# Patient Record
Sex: Female | Born: 1980 | Race: Asian | Hispanic: No | Marital: Single | Smoking: Current every day smoker
Health system: Southern US, Community
[De-identification: ages and names within clinical notes are randomized; demographics above are authoritative.]

## PROBLEM LIST (undated history)

## (undated) DIAGNOSIS — F17200 Nicotine dependence, unspecified, uncomplicated: Secondary | ICD-10-CM

## (undated) DIAGNOSIS — N912 Amenorrhea, unspecified: Secondary | ICD-10-CM

## (undated) DIAGNOSIS — J189 Pneumonia, unspecified organism: Secondary | ICD-10-CM

## (undated) DIAGNOSIS — T7840XA Allergy, unspecified, initial encounter: Secondary | ICD-10-CM

## (undated) DIAGNOSIS — E119 Type 2 diabetes mellitus without complications: Secondary | ICD-10-CM

## (undated) HISTORY — DX: Type 2 diabetes mellitus without complications: E11.9

## (undated) HISTORY — DX: Pneumonia, unspecified organism: J18.9

## (undated) HISTORY — DX: Allergy, unspecified, initial encounter: T78.40XA

## (undated) HISTORY — DX: Amenorrhea, unspecified: N91.2

## (undated) HISTORY — DX: Nicotine dependence, unspecified, uncomplicated: F17.200

## (undated) HISTORY — DX: Morbid (severe) obesity due to excess calories: E66.01

## (undated) HISTORY — PX: DILATION AND CURETTAGE OF UTERUS: SHX78

---

## 2009-10-14 DIAGNOSIS — J189 Pneumonia, unspecified organism: Secondary | ICD-10-CM

## 2009-10-14 HISTORY — DX: Pneumonia, unspecified organism: J18.9

## 2009-10-17 DIAGNOSIS — E669 Obesity, unspecified: Secondary | ICD-10-CM

## 2009-10-17 DIAGNOSIS — E1169 Type 2 diabetes mellitus with other specified complication: Secondary | ICD-10-CM | POA: Insufficient documentation

## 2009-10-25 ENCOUNTER — Inpatient Hospital Stay (HOSPITAL_COMMUNITY): Admission: EM | Admit: 2009-10-25 | Discharge: 2009-10-30 | Payer: Self-pay | Admitting: Emergency Medicine

## 2009-10-25 ENCOUNTER — Ambulatory Visit: Payer: Self-pay | Admitting: Emergency Medicine

## 2009-10-26 ENCOUNTER — Encounter: Payer: Self-pay | Admitting: Internal Medicine

## 2009-10-26 LAB — CONVERTED CEMR LAB
ALT: 25 units/L
AST: 22 units/L
CO2: 20 meq/L
LDL Cholesterol: 42 mg/dL
Potassium: 3.9 meq/L
Sodium: 136 meq/L
TSH: 1.849 microintl units/mL

## 2009-10-27 ENCOUNTER — Encounter: Payer: Self-pay | Admitting: Internal Medicine

## 2009-10-27 LAB — CONVERTED CEMR LAB
Hemoglobin: 12.7 g/dL
Platelets: 148 10*3/uL
RBC: 3.97 M/uL

## 2009-10-28 ENCOUNTER — Encounter: Payer: Self-pay | Admitting: Internal Medicine

## 2009-10-28 LAB — CONVERTED CEMR LAB
HCT: 33.3 %
Hemoglobin: 11.7 g/dL
MCV: 91.9 fL
RDW: 13.5 %
WBC: 15 10*3/uL

## 2009-11-06 ENCOUNTER — Ambulatory Visit: Payer: Self-pay | Admitting: Pulmonary Disease

## 2009-11-06 DIAGNOSIS — J189 Pneumonia, unspecified organism: Secondary | ICD-10-CM

## 2009-11-09 ENCOUNTER — Encounter: Payer: Self-pay | Admitting: Adult Health

## 2009-11-19 ENCOUNTER — Telehealth: Payer: Self-pay | Admitting: Adult Health

## 2009-11-19 ENCOUNTER — Encounter: Payer: Self-pay | Admitting: Adult Health

## 2009-11-20 ENCOUNTER — Ambulatory Visit: Payer: Self-pay | Admitting: Pulmonary Disease

## 2009-11-20 LAB — CONVERTED CEMR LAB
BUN: 8 mg/dL (ref 6–23)
CO2: 30 meq/L (ref 19–32)
Calcium: 9.4 mg/dL (ref 8.4–10.5)
GFR calc non Af Amer: 123.06 mL/min (ref >60)
Glucose, Bld: 219 mg/dL — ABNORMAL HIGH (ref 70–99)
Sodium: 139 meq/L (ref 135–145)

## 2009-11-21 ENCOUNTER — Encounter: Admission: RE | Admit: 2009-11-21 | Discharge: 2009-12-13 | Payer: Self-pay | Admitting: Critical Care Medicine

## 2009-11-30 ENCOUNTER — Encounter: Payer: Self-pay | Admitting: Internal Medicine

## 2009-12-04 ENCOUNTER — Ambulatory Visit: Payer: Self-pay | Admitting: Internal Medicine

## 2009-12-04 DIAGNOSIS — R0989 Other specified symptoms and signs involving the circulatory and respiratory systems: Secondary | ICD-10-CM

## 2009-12-04 DIAGNOSIS — R0609 Other forms of dyspnea: Secondary | ICD-10-CM

## 2009-12-04 DIAGNOSIS — F17200 Nicotine dependence, unspecified, uncomplicated: Secondary | ICD-10-CM

## 2009-12-18 ENCOUNTER — Telehealth (INDEPENDENT_AMBULATORY_CARE_PROVIDER_SITE_OTHER): Payer: Self-pay | Admitting: *Deleted

## 2010-02-12 ENCOUNTER — Ambulatory Visit: Payer: Self-pay | Admitting: Internal Medicine

## 2010-03-15 ENCOUNTER — Ambulatory Visit: Payer: Self-pay | Admitting: Internal Medicine

## 2010-03-15 LAB — CONVERTED CEMR LAB
Creatinine,U: 60.7 mg/dL
Hgb A1c MFr Bld: 8.2 % — ABNORMAL HIGH (ref 4.6–6.5)
Microalb Creat Ratio: 0.7 mg/g (ref 0.0–30.0)
Microalb, Ur: 0.4 mg/dL (ref 0.0–1.9)

## 2010-03-18 ENCOUNTER — Encounter (INDEPENDENT_AMBULATORY_CARE_PROVIDER_SITE_OTHER): Payer: Self-pay | Admitting: *Deleted

## 2010-03-26 ENCOUNTER — Telehealth: Payer: Self-pay | Admitting: Internal Medicine

## 2010-06-21 ENCOUNTER — Ambulatory Visit: Admit: 2010-06-21 | Payer: Self-pay | Admitting: Internal Medicine

## 2010-07-16 NOTE — Letter (Signed)
Summary: [facility]  [address]   [place], [place] [address]   Phone: [phone]  Fax: [phone]    11/19/2009  [address] [place], [place]  [address]  Dear [name],    We have been attempting to contact you in regards to your lab results. The contact number we have in our system is disconnected. Please call out office at your earliest convenience to discuss your lab results. Please provide a working contact number at that time. Thank you.           Sincerely,   Nature conservation officer Pulmonary Division

## 2010-07-16 NOTE — Letter (Signed)
Summary: Results Follow-up Letter  [facility]  [address] [place], [place] [address]   Phone: [phone]  Fax: [phone]    03/18/2010  [address] [place], [place]  [address]  Dear Ms. Peasley,   The following are the results of your recent lab test 03/15/10 Test     Result     A1C                8.2  Ms. Gaugh we have been trying to contact you to make sure you have listen to your results on phone-tree. The contact number we have on file states has been disconnected. Dr. Felicity Coyer left you a message and we need to send a medication to your phamacy. Please at your earliest convience contact our office so we can send new rx to your pharmacy, and update your contact list.     Sincerely,  Orlan Leavens RMA for Dr. [facility]

## 2010-07-16 NOTE — Assessment & Plan Note (Signed)
Summary: 2 weeks/apc   Primary Provider/Referring Provider:  Newt Lukes MD  CC:  Pt is here for a 2 week f/u appt.  Pt denied sob and cough or chest pain.  Pt denied any new complaints.  .  History of Present Illness: 30 yo female for follow up of pneumonia.  She denies cough, wheeze, congestion, sputum, hemoptysis, fever, or chest pain.  She has some allergies, but has improved with OTC claritin.  Garin diabetes has been under control, and she has not needed to use Dantonio sliding scale.  She is due to see Dr. Felicity Coyer later this month.  CXR  Procedure date:  11/20/2009  Findings:      CHEST - 2 VIEW   Comparison: 10/28/2009   Findings: Interval resolution of right middle and lower lobe infiltrates is seen since prior study.  There is no evidence of pleural effusion.  Heart size and mediastinal contours are normal.   IMPRESSION: Resolution of right midlung lower lobe infiltrates.  No active disease.   Medications Prior to Update: 1)  Amaryl 2 Mg Tabs (Glimepiride) .... Take 1 Tablet By Mouth Once A Day 2)  Metformin Hcl 1000 Mg Tabs (Metformin Hcl) .Marland Kitchen.. 1 Tablet By Mouth Daily At Supper 3)  Novolin R 100 Unit/ml Soln (Insulin Regular Human) .... Sliding Scale With Each Meal  Allergies (verified): No Known Drug Allergies  Past History:  Past Medical History: DM--new dx 10/25/09 w/ A1C at 11.1 -rx amaryl and metformin at discharge   Pnuemonia- early ARDS/sepsis admitted 10/25/09 -seen by CCM responded well to IV abx and fluids/pressors.   Allergic rhinitis  Vital Signs:  Patient profile:   30 year old female Height:      63 inches Weight:      244 pounds BMI:     43.38 O2 Sat:      96 % on Room air Temp:     98.1 degrees F oral Pulse rate:   102 / minute BP sitting:   108 / 66  (left arm) Cuff size:   large  Vitals Entered By: Arman Filter LPN (November 20, 1608 4:39 PM)  O2 Flow:  Room air  Physical Exam  General:  healthy appearing and obese.   Nose:   no deformity, discharge, inflammation, or lesions Mouth:  no deformity or lesions Neck:  no masses, thyromegaly, or abnormal cervical nodes Lungs:  clear bilaterally to auscultation and percussion Heart:  regular rate and rhythm, S1, S2 without murmurs, rubs, gallops, or clicks Extremities:  no clubbing, cyanosis, edema, or deformity noted Cervical Nodes:  no significant adenopathy   Impression & Recommendations:  Problem # 1:  PNEUMONIA (ICD-486) This has resolved clinically and radiographically.  She does not need any further pulmonary follow up unless Kawecki symptoms recur.  Problem # 2:  DM (ICD-250.00) She is scheduled to follow up wtih [facility] on June 21.  Complete Medication List: 1)  Amaryl 2 Mg Tabs (Glimepiride) .... Take 1 tablet by mouth once a day 2)  Metformin Hcl 1000 Mg Tabs (Metformin hcl) .Marland Kitchen.. 1 tablet by mouth daily at supper 3)  Novolin R 100 Unit/ml Soln (Insulin regular human) .... Sliding scale with each meal  Other Orders: T-2 View CXR (71020TC) Est. Patient Level II (96045)  Patient Instructions: 1)  Follow up with pulmonary as needed

## 2010-07-16 NOTE — Assessment & Plan Note (Signed)
Summary: NEW / MEDCOST/#/CD   Vital Signs:  Patient profile:   30 year old female Height:      63 inches (160.02 cm) Weight:      241.4 pounds (109.73 kg) O2 Sat:      97 % on Room air Temp:     98.1 degrees F (36.72 degrees C) oral Pulse rate:   109 / minute BP sitting:   110 / 72  (left arm) Cuff size:   large  Vitals Entered By: Orlan Leavens (December 04, 2009 2:05 PM)  O2 Flow:  Room air CC: New patient Is Patient Diabetic? Yes Did you bring your meter with you today? No Pain Assessment Patient in pain? no      Comments Pt states she is out of [place] meds need new rx's   Primary Care Provider:  Newt Lukes MD  CC:  New patient.  History of Present Illness: new pt to me and our division - here to est care -  1) DM2 - new dx during 10/2009 hospitalization-  checks sugars 3x/day - range 150s no hypoglycemia symptoms or events 100% compliance with oral meds, no need for use of SSI since home has been 2 out of 3 education classes for same - working on deit and weight loss-  2) recent hosp for PNA/ARDS 10/2009 dc summary reviewed-  breathing back to baseline - no SOB or cough  3) snoring - ?sleep apnea - +daytiime fatigue - rec to have sleep study done while in hosp -- would like to arrange now  Preventive Screening-Counseling & Management  Alcohol-Tobacco     Alcohol drinks/day: <1     Alcohol Counseling: not indicated; use of alcohol is not excessive or problematic     Smoking Status: current     Tobacco Counseling: to quit use of tobacco products  Caffeine-Diet-Exercise     Diet Counseling: to improve diet; diet is suboptimal     Nutrition Referrals: no     Exercise Counseling: to improve exercise regimen  Safety-Violence-Falls     Seat Belt Counseling: not indicated; patient wears seat belts     Helmet Counseling: not indicated; patient wears helmet when riding bicycle/motocycle     Firearms in the Home: no firearms in the home     Smoke Detectors:  yes     Violence in the Home: no risk noted     Sexual Abuse: no     Fall Risk Counseling: not indicated; no significant falls noted  Clinical Review Panels:  Immunizations   Last Pneumovax:  Historical (10/26/2009)  Lipid Management   Cholesterol:  106 (10/26/2009)   LDL (bad choesterol):  42 (10/26/2009)   HDL (good cholesterol):  32 (10/26/2009)   Triglycerides:  160 (10/26/2009)  Diabetes Management   HgBA1C:  11.1 (10/26/2009)   Creatinine:  0.6 (11/06/2009)   Last Pneumovax:  Historical (10/26/2009)  CBC   WBC:  15.0 (10/28/2009)   RBC:  3.62 (10/28/2009)   Hgb:  11.7 (10/28/2009)   Hct:  33.3 (10/28/2009)   Platelets:  173 (10/28/2009)   MCV  91.9 (10/28/2009)   RDW  13.5 (10/28/2009)  Complete Metabolic Panel   Glucose:  219 (11/06/2009)   Sodium:  139 (11/06/2009)   Potassium:  4.3 (11/06/2009)   Chloride:  103 (11/06/2009)   CO2:  30 (11/06/2009)   BUN:  8 (11/06/2009)   Creatinine:  0.6 (11/06/2009)   Albumin:  2.9 (10/26/2009)   Total Protein:  6.2 (10/26/2009)  Calcium:  9.4 (11/06/2009)   Total Bili:  0.2 (10/26/2009)   Alk Phos:  46 (10/26/2009)   SGPT (ALT):  25 (10/26/2009)   SGOT (AST):  22 (10/26/2009)   Current Medications (verified): 1)  Amaryl 2 Mg Tabs (Glimepiride) .... Take 1 Tablet By Mouth Once A Day 2)  Metformin Hcl 1000 Mg Tabs (Metformin Hcl) .Marland Kitchen.. 1 Tablet By Mouth Daily At Supper 3)  Novolin R 100 Unit/ml Soln (Insulin Regular Human) .... 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 I have reviewed all other systems and they were negative.   Physical Exam  General:  overweight-appearing.  alert, well-developed, well-nourished, and cooperative to examination.    Eyes:  vision grossly intact; pupils equal, round and reactive to light.  conjunctiva and lids normal.    Ears:  normal pinnae bilaterally, without erythema, swelling, or tenderness to palpation. TMs clear, without effusion, or cerumen impaction. Hearing grossly normal bilaterally  Mouth:  teeth and gums in good repair; mucous membranes moist, without lesions or ulcers. oropharynx clear without exudate, no erythema.  Neck:  thick, well healed JV scar -supple, full ROM, no masses, no thyromegaly; no thyroid nodules or tenderness. no JVD or carotid bruits.   Lungs:  normal respiratory effort, no intercostal retractions or use of accessory muscles; normal breath sounds bilaterally - no crackles and no wheezes.    Heart:  normal rate, regular rhythm, no murmur, and no rub. BLE without edema.  Abdomen:  obese, soft, non-tender, normal bowel sounds, no distention; no masses and no appreciable hepatomegaly or splenomegaly.   Msk:  No deformity or scoliosis noted of thoracic or lumbar spine.   Neurologic:  alert & oriented X3 and cranial nerves II-XII symetrically intact.  strength normal in all extremities, sensation intact to light touch, and gait normal. speech fluent without dysarthria or aphasia; follows commands with good comprehension.  Skin:  no rashes, vesicles, ulcers, or erythema. No nodules or irregularity to palpation.  Psych:  Oriented X3, memory intact for recent and remote, normally interactive, good eye contact, not anxious appearing, not depressed appearing, and not agitated.      Impression & Recommendations:  Problem # 1:  DIABETES MELLITUS, TYPE II (ICD-250.00)  new dx 10/2009 now on med tx   for same - cont same will not ACEI at this time pending poss pregnancy wishes - recieved education on insulin use in hosp so could be changed to insulin if/once preganacy confirmed  pt not pursing activly at this time and advised to wait until DM under better control to minimize preg risks - will refer to endo as needed  Time spent with patient 45 minutes, more than 50% of this time was spent counseling patient on diabetes, medication, & possible pregnancy [place] updated medication list for this problem includes:    Amaryl 2 Mg Tabs (Glimepiride) .Marland Kitchen... Take 1 tablet by mouth once a day - hold if sugar <100  Metformin Hcl 500 Mg Xr24h-tab (Metformin hcl) .Marland Kitchen... 1 by mouth two times a day    Novolin R 100 Unit/ml Soln (Insulin regular human) ..... Sliding scale with each meal  Labs Reviewed: Creat: 0.6 (11/06/2009)    Reviewed HgBA1c results: 11.1 (10/26/2009)  Problem # 2:  SNORING (ICD-786.09)  body habitus suggestive of poss OSA - refer for sleep study and pulm eval if needed  Recommended fluid and salt restriction.   Orders: Misc. Referral (Misc. Ref)  Problem # 3:  MORBID OBESITY (ICD-278.01)  Orders: Misc. Referral (Misc. Ref)  Ht: 63 (12/04/2009)   Wt: 241.4 (12/04/2009)   BMI: 43.38 (11/20/2009)  Problem # 4:  PNEUMONIA (ICD-486)  hosp dc summary, tests and labs reviewed -  has been cleared by pulm in f/u since dc  Complete Medication List: 1)  Amaryl 2 Mg Tabs (Glimepiride) .... Take 1 tablet by mouth once a day - hold if sugar <100 2)  Metformin Hcl 500 Mg Xr24h-tab (Metformin hcl) .Marland Kitchen.. 1 by mouth two times a day 3)  Novolin R 100 Unit/ml Soln (Insulin regular human) .... Sliding scale with each meal  Patient Instructions: 1)  it was good to see you today.  2)  continue medications for diabetes as discussed - 3)  if you wish to become pregnant, let us know so we can have you evaluated by endocrinology for optimum treatment of your diabetes before you become  pregnant 4)  we'll make referral for sleep study to look for sleep apnea. Our office will contact you regarding this appointment once made.  If referral to Dr. Craige Cotta is needed after these results are reviewed, we will arrange this for you. 5)  Please schedule a follow-up appointment in 2 months (late August), call sooner if problems.  Prescriptions: METFORMIN HCL 500 MG XR24H-TAB (METFORMIN HCL) 1 by mouth two times a day  #60 x 6   Entered and Authorized by:   Newt Lukes MD   Signed by:   Newt Lukes MD on 12/04/2009   Method used:   Print then Give to Patient   RxID:   [id number] AMARYL 2 MG TABS (GLIMEPIRIDE) Take 1 tablet by mouth once a day - hold if sugar <100  #30 x 6   Entered and Authorized by:   Newt Lukes MD   Signed by:   Newt Lukes MD on 12/04/2009   Method used:   Print then Give to Patient   RxID:   [id number]

## 2010-07-16 NOTE — Progress Notes (Signed)
Summary: amaryl  Phone Note Call from Patient   Caller: Patient Summary of Call: Pt call back stating recieved letter concerning labs. Would like to pick rx up. Called pt rx ready for pick-up, and updated new phone #. Initial call taken by: Orlan Leavens RMA,  March 26, 2010 2:40 PM    Prescriptions: AMARYL 2 MG TABS (GLIMEPIRIDE) Take 1 tablet by mouth two times a day -hold if sugar <100  #60 x 3   Entered by:   Orlan Leavens RMA   Authorized by:   Newt Lukes MD   Signed by:   Orlan Leavens RMA on 03/26/2010   Method used:   Print then Give to Patient   RxID:   [id number]

## 2010-07-16 NOTE — Assessment & Plan Note (Signed)
Summary: NP follow up - post hosp   CC:  post hosp follow up - states breathing is doing and no complaints today.  History of Present Illness:    Nov 06, 2009 Presents for a post hospital visit. She was admitted 10/25/2009-- 10/30/2009 for  Community-acquired pneumonia. and  Diabetes mellitus.   Presented w/ acute onset of fever and body aches, diarrhea.  CT of the abdomen confirmed right middle lobe pneumonia.  On arrival to ER she was also found to have severe sepsis with a lactate 6 and high procalcitonin.  Subsequently she developed shock despite 5 liters of fluid, needed pressor support, and pulmonary critical care was consulted at that time.    She was placed on sepsis protocol.    Cx were neg. Tx w/ rocephin and zithromax. , dishcarged on   on Augmentin for a total of 10 days.   [place] blood sugars were elvated.   Hemoglobin A1c on May 13 was 11.1. She was tx for DM and discharged on metformin  and amaryl.    Since discharge she has finished abx. She is feeling much better. Denies chest pain, dyspnea, orthopnea, hemoptysis, fever, n/v/d, edema, headache. Appetite is back and good. Blood sugars at home at 170. She has scheduled follow up w/ PCP establiahment in June w/ Dr. Felicity Coyer.  No hypolglycemic episodes.   Current Medications (verified): 1)  Amaryl 2 Mg Tabs (Glimepiride) .... Take 1 Tablet By Mouth Once A Day 2)  Metformin Hcl 1000 Mg Tabs (Metformin Hcl) .Marland Kitchen.. 1 Tablet By Mouth Daily At Supper 3)  Novolin R 100 Unit/ml Soln (Insulin Regular Human) .... 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   Past Medical History: DM--new dx 10/25/09 w/ A1C at 11.1 -rx amaryl and metformin at discharge   Pnuemonia- early  ARDS/sepsis admitted 10/25/09 -seen by CCM responded well to IV abx and fluids/pressors.   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   Review of Systems      See HPI  Vital Signs:  Patient profile:   30 year old female Height:      63 inches Weight:      242 pounds BMI:     43.02 O2 Sat:      99 % on Room air Temp:     97.5 degrees F oral Pulse rate:   75 / minute BP sitting:   112 / 66  (right arm) Cuff size:   large  Vitals Entered By: Boone Master CNA/MA (Nov 06, 2009 10:15 AM)  O2 Flow:  Room air CC: post hosp follow up - states breathing is doing, no complaints today Is Patient Diabetic? Yes Comments Medications reviewed with patient Daytime contact number verified with patient. Boone Master CNA/MA  Nov 06, 2009 10:19 AM    Physical Exam  Additional Exam:  GEN: A/Ox3; pleasant , NAD, young female HEENT:  [place]/AT, , EACs-clear, TMs-wnl, NOSE-clear, THROAT-clear NECK:  Supple w/ fair ROM; no JVD; normal carotid impulses w/o bruits; no thyromegaly or nodules palpated; no lymphadenopathy. RESP  Clear to P & A; w/o, wheezes/ rales/ or rhonchi. CARD:  RRR, no m/r/g   GI:   Soft &  nt; nml bowel sounds; no organomegaly or masses detected. [place]: Warm bil,  no calf tenderness edema, clubbing, pulses intact Neuro: intact w/ no focal deficits noted.    Impression & Recommendations:  Problem # 1:  PNEUMONIA (ICD-486)  CAP w/ assoicated sepsis and early ARDS. She responded very well to IV abx and fluids/pressor support during hospitalization.  she has now finished all abx. and is improving.  will follow up 2 weeks w/ follow up cxr.   Orders: Est. Patient Level IV (04540)  Problem # 2:  DIABETES MELLITUS, TYPE II, UNCONTROLLED (ICD-250.02) Found to have new onset DM during hospital stay.  avg bs at home  ~170. A1c during stay ws 11.1.  she has  follow up in 1 month w/ Dr. Felicity Coyer.  bmet today.  [place] updated medication list for this problem includes:    Amaryl 2 Mg Tabs (Glimepiride) .Marland Kitchen... Take 1 tablet by mouth once a day    Metformin Hcl 1000 Mg Tabs (Metformin hcl) .Marland Kitchen... 1 tablet by mouth daily at supper    Novolin R 100 Unit/ml Soln (Insulin regular human) ..... Sliding scale with each meal  Orders: TLB-BMP (Basic Metabolic Panel-BMET) (80048-METABOL) Est. Patient Level IV (98119)  Medications Added to Medication List This Visit: 1)  Amaryl 2 Mg Tabs (Glimepiride) .... Take 1 tablet by mouth once a day 2)  Metformin Hcl 1000 Mg Tabs (Metformin hcl) .Marland Kitchen.. 1 tablet by mouth daily at supper 3)  Novolin R 100 Unit/ml Soln (Insulin regular human) .... Sliding scale with each meal  Complete Medication List: 1)  Amaryl 2 Mg Tabs (Glimepiride) .... Take 1 tablet by mouth once a day 2)  Metformin Hcl 1000 Mg Tabs (Metformin hcl) .Marland Kitchen.. 1 tablet by mouth daily at supper 3)  Novolin R 100 Unit/ml Soln (Insulin regular human) .... Sliding scale with each meal  Patient Instructions: 1)  Continue on current regimen 2)  I will call with labs results.  3)  follow up 2 weeks Dr. Craige Cotta w/ chest xray for Pneumonia.  4)  Please contact office for sooner follow up if symptoms do not improve or worsen  5)  follow up Dr. Felicity Coyer as scheduled 6/21 at 2pm for diabetes.    Immunization History:  Pneumovax Immunization History:    Pneumovax:  historical (10/26/2009)

## 2010-07-16 NOTE — Progress Notes (Signed)
----   Converted from flag ---- ---- 12/18/2009 8:27 AM, Newt Lukes MD wrote: please just let pt know same, then cancel sleep study order - thanks  ---- 12/14/2009 8:45 AM, Dagoberto Reef wrote: Called pt insurance and was told sleep studies is not covered under his plan.  Please advise .   Thanks  ---- 12/07/2009 9:20 AM, Dagoberto Reef wrote:   ---- 12/04/2009 3:03 PM, Newt Lukes MD wrote: The following orders have been entered for this patient and placed on Admin Hold:  Type:     Referral       Code:   Misc. Ref Description:   Misc. Referral Order Date:   12/04/2009   Authorized By:   Newt Lukes MD Order #:   [id number] Clinical Notes:   Type of Referral:OP sleep study Reason:eval for poss sleep apnea ------------------------------

## 2010-07-16 NOTE — Progress Notes (Signed)
Summary: results  Phone Note Call from Patient   Caller: Patient Call For: parrett Summary of Call: lab results Initial call taken by: Rickard Patience,  November 19, 2009 1:15 PM  Follow-up for Phone Call        Number in EMR still disconnected, again unable to reach the pt with lab results.  Will send another letter and ask the pt to provide a contact number.Carron Curie CMA  November 19, 2009 1:26 PM      Appended Document: results updated pt's phone # with the correct # verified by pt

## 2010-07-16 NOTE — Assessment & Plan Note (Signed)
Summary: OV--STC   Vital Signs:  Patient profile:   30 year old female Height:      63 inches (160.02 cm) Weight:      248.4 pounds (112.91 kg) O2 Sat:      97 % on Room air Temp:     98.8 degrees F (37.11 degrees C) oral Pulse rate:   107 / minute BP sitting:   102 / 72  (left arm) Cuff size:   large  Vitals Entered By: Orlan Leavens RMA (March 15, 2010 1:14 PM)  O2 Flow:  Room air CC: follow-up visit Is Patient Diabetic? Yes Did you bring your meter with you today? No Pain Assessment Patient in pain? no        Primary Care Tashari Schoenfelder:  Newt Lukes MD  CC:  follow-up visit.  History of Present Illness: here for followup -  1) DM2 - new dx during 10/2009 hospitalization-  checks sugars 3x/day - range 150s, occ 200 no hypoglycemia symptoms or events 100% compliance with oral meds, no need for use of SSI since home has been to education classes for same - working on diet and weight loss-  2) prior hosp for PNA/ARDS 10/2009 breathing back to baseline - no SOB or cough  3) snoring - ?sleep apnea - +daytiime fatigue - no sleep disruption rec to have sleep study done while in hosp 10/2009 - unable to arrange due to insurance issues - spouse at side - denies "apneic" events or snoring  4) obesity - diet and exercise efforts ongoing - but unsuccessful thus far  Clinical Review Panels:  Diabetes Management   HgBA1C:  11.1 (10/26/2009)   Creatinine:  0.6 (11/06/2009)   Last Pneumovax:  Historical (10/26/2009)   Current Medications (verified): 1)  Amaryl 2 Mg Tabs (Glimepiride) .... Take 1 Tablet By Mouth Once A Day - Hold If Sugar <100 2)  Metformin Hcl 500 Mg Xr24h-Tab (Metformin Hcl) .Marland Kitchen.. 1 By Mouth Two Times A Day 3)  Novolin R 100 Unit/ml Soln (Insulin Regular Human) .... Sliding Scale With Each Meal  Allergies (verified): No Known Drug Allergies  Past History:  Past Medical History: DM 2--new dx 10/25/09  hx Pneumonia- early ARDS/sepsis  10/25/09 Allergic rhinitis    MD roster -  CCM -Sood  Review of Systems  The patient denies fever, weight loss, chest pain, syncope, headaches, and abdominal pain.    Physical Exam  General:  overweight-appearing.  alert, well-developed, well-nourished, and cooperative to examination.   spouse at side Lungs:  normal respiratory effort, no intercostal retractions or use of accessory muscles; normal breath sounds bilaterally - no crackles and no wheezes.    Heart:  normal rate, regular rhythm, no murmur, and no rub. BLE without edema.    Impression & Recommendations:  Problem # 1:  DIABETES MELLITUS, TYPE II (ICD-250.00)  [place] updated medication list for this problem includes:    Amaryl 2 Mg Tabs (Glimepiride) .Marland Kitchen... Take 1 tablet by mouth once a day - hold if sugar <100    Metformin Hcl 500 Mg Xr24h-tab (Metformin hcl) .Marland Kitchen... 1 by mouth two times a day    Novolin R 100 Unit/ml Soln (Insulin regular human) ..... Sliding scale with each meal  Orders: TLB-A1C / Hgb A1C (Glycohemoglobin) (83036-A1C) TLB-Microalbumin/Creat Ratio, Urine (82043-MALB)  new dx 10/2009 now on med tx  for same -  recheck labs today will not ACEI at this time pending poss pregnancy wishes - but monitor for microalb recieved education on  insulin use in hosp so could be changed to insulin if/once preganacy confirmed  pt not pursing activly at this time and advised to wait until DM under better control to minimize preg risks - will refer to endo as needed when ready to pursue pregnancy  Labs Reviewed: Creat: 0.6 (11/06/2009)    Reviewed HgBA1c results: 11.1 (10/26/2009)  Problem # 2:  MORBID OBESITY (ICD-278.01)  Ht: 63 (12/04/2009)   Wt: 241.4 (12/04/2009)   BMI: 43.38 (11/20/2009)  Ht: 63 (03/15/2010)   Wt: 248.4 (03/15/2010)   BMI: 43.38 (11/20/2009)  Problem # 3:  SNORING (ICD-786.09)  body habitus suggestive of poss OSA - refered for sleep study 11/2009 but denied by insurance (not covered) pulm  eval if needed but pt/spouse decline at this time  Complete Medication List: 1)  Amaryl 2 Mg Tabs (Glimepiride) .... Take 1 tablet by mouth once a day - hold if sugar <100 2)  Metformin Hcl 500 Mg Xr24h-tab (Metformin hcl) .Marland Kitchen.. 1 by mouth two times a day 3)  Novolin R 100 Unit/ml Soln (Insulin regular human) .... Sliding scale with each meal  Patient Instructions: 1)  it was good to see you today.  2)  continue medications for diabetes as discussed - 3)  if you wish to become pregnant, let us know so we can have you evaluated by endocrinology for optimum treatment of your diabetes before you become pregnant 4)  test(s) ordered today - your results will be posted on the phone tree for review in 48-72 hours from the time of test completion; call [phone] and enter your 9 digit MRN (listed above on this page, just below your name); if any changes need to be made or there are abnormal results, you will be contacted directly.  5)  Please schedule a follow-up appointment in 3 months to monitor diabetes and weight, call sooner if problems.

## 2010-07-16 NOTE — Letter (Signed)
Summary: [facility] Results Follow Up Letter  [facility]  [address]   [place], [place] [address]   Phone: [phone]  Fax: [phone]    11/09/2009 MRN: [id number]  Sheri Rasmussen [name] [place], [place]  [address]  Dear Sheri Rasmussen,  We have received the results from your recent tests and have been unable to contact you.  Please call our office at [phone] so that [name] NP or [name] nurse may review the results with you.    Thank you,  Nature conservation officer Pulmonary Division

## 2010-07-26 ENCOUNTER — Other Ambulatory Visit: Payer: Self-pay | Admitting: Internal Medicine

## 2010-07-26 ENCOUNTER — Other Ambulatory Visit: Payer: PRIVATE HEALTH INSURANCE

## 2010-07-26 ENCOUNTER — Encounter: Payer: Self-pay | Admitting: Internal Medicine

## 2010-07-26 ENCOUNTER — Ambulatory Visit (INDEPENDENT_AMBULATORY_CARE_PROVIDER_SITE_OTHER): Payer: PRIVATE HEALTH INSURANCE | Admitting: Internal Medicine

## 2010-07-26 DIAGNOSIS — E119 Type 2 diabetes mellitus without complications: Secondary | ICD-10-CM

## 2010-07-26 DIAGNOSIS — N912 Amenorrhea, unspecified: Secondary | ICD-10-CM

## 2010-07-26 LAB — CREATININE, SERUM: Creatinine, Ser: 0.7 mg/dL (ref 0.4–1.2)

## 2010-07-26 LAB — HEMOGLOBIN A1C: Hgb A1c MFr Bld: 9.2 % — ABNORMAL HIGH (ref 4.6–6.5)

## 2010-08-01 NOTE — Assessment & Plan Note (Signed)
Summary: f/u appt   Vital Signs:  Patient profile:   30 year old female Height:      63 inches (160.02 cm) Weight:      260 pounds (118.18 kg) O2 Sat:      95 % on Room air Temp:     98.5 degrees F (36.94 degrees C) oral Pulse rate:   93 / minute BP sitting:   112 / 72  (left arm) Cuff size:   large  Vitals Entered By: Orlan Leavens RMA (July 26, 2010 3:54 PM)  O2 Flow:  Room air CC: follow-up visit Is Patient Diabetic? Yes Did you bring your meter with you today? No Pain Assessment Patient in pain? no        Primary Care Provider:  Newt Lukes MD  CC:  follow-up visit.  History of Present Illness: here for followup -  1) DM2 - new dx during 10/2009 hospitalization-  checks sugars 1-3x/day - range 150s, occ 200 no hypoglycemia symptoms or events reports 100% compliance with oral meds but no recent refills provided has not used SSI since 10/2009 has been to education classes for same - working on diet and weight loss- unsuccessful thus far  2) c/o no period >23mo not sexually active - no risk pregnancy - hx same 3 years ago - had ?d&c then put on hormones - ?if needs to resume same  3) morbid obesity - reports diet and exercise efforts ongoing - but unsuccessful thus far - weight gain since last OV reviewed  Clinical Review Panels:  Lipid Management   Cholesterol:  106 (10/26/2009)   LDL (bad choesterol):  42 (10/26/2009)   HDL (good cholesterol):  32 (10/26/2009)   Triglycerides:  160 (10/26/2009)  Diabetes Management   HgBA1C:  8.2 (03/15/2010)   Creatinine:  0.6 (11/06/2009)   Last Pneumovax:  Historical (10/26/2009)  CBC   WBC:  15.0 (10/28/2009)   RBC:  3.62 (10/28/2009)   Hgb:  11.7 (10/28/2009)   Hct:  33.3 (10/28/2009)   Platelets:  173 (10/28/2009)   MCV  91.9 (10/28/2009)   RDW  13.5 (10/28/2009)  Complete Metabolic Panel   Glucose:  219 (11/06/2009)   Sodium:  139 (11/06/2009)   Potassium:  4.3 (11/06/2009)   Chloride:  103  (11/06/2009)   CO2:  30 (11/06/2009)   BUN:  8 (11/06/2009)   Creatinine:  0.6 (11/06/2009)   Albumin:  2.9 (10/26/2009)   Total Protein:  6.2 (10/26/2009)   Calcium:  9.4 (11/06/2009)   Total Bili:  0.2 (10/26/2009)   Alk Phos:  46 (10/26/2009)   SGPT (ALT):  25 (10/26/2009)   SGOT (AST):  22 (10/26/2009)   Current Medications (verified): 1)  Amaryl 2 Mg Tabs (Glimepiride) .... Take 1 Tablet By Mouth Two Times A Day -Hold If Sugar <100 2)  Metformin Hcl 500 Mg Xr24h-Tab (Metformin Hcl) .Marland Kitchen.. 1 By Mouth Two Times A Day 3)  Novolin R 100 Unit/ml Soln (Insulin Regular Human) .... Sliding Scale With Each Meal  Allergies (verified): No Known Drug Allergies  Past History:  Past Medical History: DM 2--new dx 10/25/09  hx Pneumonia- early ARDS/sepsis 10/25/09 Allergic rhinitis morbid obesity    MD roster: Edward Qualia  Review of Systems  The patient denies weight loss, chest pain, syncope, and headaches.    Physical Exam  General:  overweight-appearing.  alert, well-developed, well-nourished, and cooperative to examination.    Lungs:  normal respiratory effort, no intercostal retractions or use of accessory muscles;  normal breath sounds bilaterally - no crackles and no wheezes.    Heart:  normal rate, regular rhythm, no murmur, and no rub. BLE without edema.  Psych:  Oriented X3, memory intact for recent and remote, normally interactive, good eye contact, not anxious appearing, not depressed appearing, and not agitated.      Impression & Recommendations:  Problem # 1:  DIABETES MELLITUS, TYPE II (ICD-250.00) dx 10/2009 hosp for ARDS uncontrolled by hx - weight gain noted - add januvia to current meds - sample and copay card provided to help with expense reminded of need for med compliance and weight loss not on ACEI due to desire for eventual pregnancy [place] updated medication list for this problem includes:    Amaryl 2 Mg Tabs (Glimepiride) .Marland Kitchen... Take 1 tablet by mouth two  times a day -hold if sugar <100    Janumet 50-1000 Mg Tabs (Sitagliptin-metformin hcl) .Marland Kitchen... 1 by mouth two times a day    Novolin R 100 Unit/ml Soln (Insulin regular human) ..... Sliding scale with each meal  Orders: TLB-A1C / Hgb A1C (Glycohemoglobin) (83036-A1C) TLB-Creatinine, Blood (82565-CREA)  Labs Reviewed: Creat: 0.6 (11/06/2009)    Reviewed HgBA1c results: 8.2 (03/15/2010)  11.1 (10/26/2009)  Problem # 2:  AMENORRHEA (ICD-626.0) >63mo - eval and tx per gyn Orders: Gynecologic Referral (Gyn)  Problem # 3:  MORBID OBESITY (ICD-278.01)  Orders: Gynecologic Referral (Gyn)  Ht: 63 (12/04/2009)   Wt: 241.4 (12/04/2009)   BMI: 43.38 (11/20/2009)  Ht: 63 (03/15/2010)   Wt: 248.4 (03/15/2010)   BMI: 43.38 (11/20/2009)  Ht: 63 (07/26/2010)   Wt: 260 (07/26/2010)   BMI: 43.38 (11/20/2009)  Complete Medication List: 1)  Amaryl 2 Mg Tabs (Glimepiride) .... Take 1 tablet by mouth two times a day -hold if sugar <100 2)  Janumet 50-1000 Mg Tabs (Sitagliptin-metformin hcl) .Marland Kitchen.. 1 by mouth two times a day 3)  Novolin R 100 Unit/ml Soln (Insulin regular human) .... Sliding scale with each meal  Patient Instructions: 1)  it was good to see you today. 2)  start janumet in place of metformin alone - samples given and copay coupon card given today 3)  continue amaryl two times a day  4)  test(s) ordered today - your results will be posted on the phone tree for review in 48-72 hours from the time of test completion; call [phone] and enter your 9 digit MRN (listed above on this page, just below your name); if any changes need to be made or there are abnormal results, you will be contacted directly.  5)  it is important that you work on losing weight - monitor your diet and consume fewer calories such as less carbohydrates (sugar) and less fat. you also need to increase your physical activity level - start by walking for 10-20 minutes 3 times per week and work up to 30 minutes 4-5 times  each week.  6)  we'll make referral to gynecology for your periods. Our office will contact you regarding this appointment once made.  7)  Please schedule a follow-up appointment in 3 months to review diabetes and weight, call sooner if problems.  Prescriptions: AMARYL 2 MG TABS (GLIMEPIRIDE) Take 1 tablet by mouth two times a day -hold if sugar <100  #60 x 3   Entered and Authorized by:   Newt Lukes MD   Signed by:   Newt Lukes MD on 07/26/2010   Method used:   Print then Give to Patient   RxID:  [id number] JANUMET 50-1000 MG TABS (SITAGLIPTIN-METFORMIN HCL) 1 by mouth two times a day  #60 x 3   Entered and Authorized by:   Newt Lukes MD   Signed by:   Newt Lukes MD on 07/26/2010   Method used:   Print then Give to Patient   RxID:   [id number]    Orders Added: 1)  TLB-A1C / Hgb A1C (Glycohemoglobin) [83036-A1C] 2)  Gynecologic Referral [Gyn] 3)  TLB-Creatinine, Blood [82565-CREA] 4)  Est. Patient Level IV [88416]

## 2010-09-02 LAB — BASIC METABOLIC PANEL
BUN: 8 mg/dL (ref 6–23)
CO2: 25 mEq/L (ref 19–32)
Chloride: 107 mEq/L (ref 96–112)
Creatinine, Ser: 0.68 mg/dL (ref 0.4–1.2)
Glucose, Bld: 217 mg/dL — ABNORMAL HIGH (ref 70–99)
Potassium: 3.2 mEq/L — ABNORMAL LOW (ref 3.5–5.1)
Sodium: 138 mEq/L (ref 135–145)

## 2010-09-02 LAB — GLUCOSE, CAPILLARY
Glucose-Capillary: 155 mg/dL — ABNORMAL HIGH (ref 70–99)
Glucose-Capillary: 155 mg/dL — ABNORMAL HIGH (ref 70–99)
Glucose-Capillary: 188 mg/dL — ABNORMAL HIGH (ref 70–99)
Glucose-Capillary: 195 mg/dL — ABNORMAL HIGH (ref 70–99)
Glucose-Capillary: 221 mg/dL — ABNORMAL HIGH (ref 70–99)

## 2010-09-02 LAB — CBC
Hemoglobin: 11.7 g/dL — ABNORMAL LOW (ref 12.0–15.0)
RBC: 3.62 MIL/uL — ABNORMAL LOW (ref 3.87–5.11)
RDW: 13.5 % (ref 11.5–15.5)

## 2010-09-03 LAB — POCT I-STAT 3, VENOUS BLOOD GAS (G3P V)
TCO2: 23 mmol/L (ref 0–100)
pCO2, Ven: 42.5 mmHg — ABNORMAL LOW (ref 45.0–50.0)
pH, Ven: 7.318 — ABNORMAL HIGH (ref 7.250–7.300)

## 2010-09-03 LAB — URINALYSIS, ROUTINE W REFLEX MICROSCOPIC
Ketones, ur: 15 mg/dL — AB
Nitrite: NEGATIVE
Specific Gravity, Urine: 1.024 (ref 1.005–1.030)
Urobilinogen, UA: 0.2 mg/dL (ref 0.0–1.0)
pH: 6 (ref 5.0–8.0)

## 2010-09-03 LAB — MRSA PCR SCREENING: MRSA by PCR: NEGATIVE

## 2010-09-03 LAB — GLUCOSE, CAPILLARY
Glucose-Capillary: 114 mg/dL — ABNORMAL HIGH (ref 70–99)
Glucose-Capillary: 118 mg/dL — ABNORMAL HIGH (ref 70–99)
Glucose-Capillary: 130 mg/dL — ABNORMAL HIGH (ref 70–99)
Glucose-Capillary: 144 mg/dL — ABNORMAL HIGH (ref 70–99)
Glucose-Capillary: 163 mg/dL — ABNORMAL HIGH (ref 70–99)
Glucose-Capillary: 175 mg/dL — ABNORMAL HIGH (ref 70–99)
Glucose-Capillary: 210 mg/dL — ABNORMAL HIGH (ref 70–99)
Glucose-Capillary: 212 mg/dL — ABNORMAL HIGH (ref 70–99)
Glucose-Capillary: 215 mg/dL — ABNORMAL HIGH (ref 70–99)
Glucose-Capillary: 216 mg/dL — ABNORMAL HIGH (ref 70–99)
Glucose-Capillary: 243 mg/dL — ABNORMAL HIGH (ref 70–99)
Glucose-Capillary: 247 mg/dL — ABNORMAL HIGH (ref 70–99)
Glucose-Capillary: 263 mg/dL — ABNORMAL HIGH (ref 70–99)
Glucose-Capillary: 277 mg/dL — ABNORMAL HIGH (ref 70–99)
Glucose-Capillary: 284 mg/dL — ABNORMAL HIGH (ref 70–99)
Glucose-Capillary: 289 mg/dL — ABNORMAL HIGH (ref 70–99)

## 2010-09-03 LAB — POCT I-STAT, CHEM 8
Calcium, Ion: 1.08 mmol/L — ABNORMAL LOW (ref 1.12–1.32)
Glucose, Bld: 262 mg/dL — ABNORMAL HIGH (ref 70–99)
HCT: 51 % — ABNORMAL HIGH (ref 36.0–46.0)
Hemoglobin: 17.3 g/dL — ABNORMAL HIGH (ref 12.0–15.0)
TCO2: 23 mmol/L (ref 0–100)

## 2010-09-03 LAB — CBC
Hemoglobin: 13.1 g/dL (ref 12.0–15.0)
Hemoglobin: 16.5 g/dL — ABNORMAL HIGH (ref 12.0–15.0)
MCHC: 34.6 g/dL (ref 30.0–36.0)
MCHC: 35.9 g/dL (ref 30.0–36.0)
MCV: 90.3 fL (ref 78.0–100.0)
MCV: 90.4 fL (ref 78.0–100.0)
MCV: 92 fL (ref 78.0–100.0)
RBC: 4.03 MIL/uL (ref 3.87–5.11)
RDW: 13.3 % (ref 11.5–15.5)
RDW: 13.5 % (ref 11.5–15.5)
WBC: 28.3 10*3/uL — ABNORMAL HIGH (ref 4.0–10.5)

## 2010-09-03 LAB — URINE CULTURE: Colony Count: NO GROWTH

## 2010-09-03 LAB — APTT: aPTT: 29 seconds (ref 24–37)

## 2010-09-03 LAB — DIFFERENTIAL
Basophils Absolute: 0 10*3/uL (ref 0.0–0.1)
Basophils Relative: 0 % (ref 0–1)
Basophils Relative: 0 % (ref 0–1)
Basophils Relative: 0 % (ref 0–1)
Eosinophils Absolute: 0 10*3/uL (ref 0.0–0.7)
Eosinophils Relative: 0 % (ref 0–5)
Eosinophils Relative: 0 % (ref 0–5)
Eosinophils Relative: 0 % (ref 0–5)
Lymphocytes Relative: 8 % — ABNORMAL LOW (ref 12–46)
Lymphs Abs: 2.3 10*3/uL (ref 0.7–4.0)
Lymphs Abs: 2.7 10*3/uL (ref 0.7–4.0)
Monocytes Absolute: 0.6 10*3/uL (ref 0.1–1.0)
Monocytes Absolute: 0.7 10*3/uL (ref 0.1–1.0)
Monocytes Absolute: 1.7 10*3/uL — ABNORMAL HIGH (ref 0.1–1.0)
Monocytes Relative: 2 % — ABNORMAL LOW (ref 3–12)
Monocytes Relative: 3 % (ref 3–12)
Neutrophils Relative %: 86 % — ABNORMAL HIGH (ref 43–77)
Neutrophils Relative %: 90 % — ABNORMAL HIGH (ref 43–77)
WBC Morphology: INCREASED
WBC Morphology: INCREASED

## 2010-09-03 LAB — COMPREHENSIVE METABOLIC PANEL
ALT: 17 U/L (ref 0–35)
ALT: 28 U/L (ref 0–35)
AST: 13 U/L (ref 0–37)
Alkaline Phosphatase: 54 U/L (ref 39–117)
BUN: 10 mg/dL (ref 6–23)
CO2: 20 mEq/L (ref 19–32)
Calcium: 8.1 mg/dL — ABNORMAL LOW (ref 8.4–10.5)
Chloride: 104 mEq/L (ref 96–112)
Creatinine, Ser: 0.63 mg/dL (ref 0.4–1.2)
GFR calc Af Amer: >60 mL/min (ref >60)
GFR calc non Af Amer: >60 mL/min (ref >60)
GFR calc non Af Amer: >60 mL/min (ref >60)
Glucose, Bld: 135 mg/dL — ABNORMAL HIGH (ref 70–99)
Glucose, Bld: 298 mg/dL — ABNORMAL HIGH (ref 70–99)
Potassium: 3.9 mEq/L (ref 3.5–5.1)
Sodium: 136 mEq/L (ref 135–145)
Sodium: 138 mEq/L (ref 135–145)
Total Bilirubin: 1.1 mg/dL (ref 0.3–1.2)
Total Protein: 6.3 g/dL (ref 6.0–8.3)
Total Protein: 6.7 g/dL (ref 6.0–8.3)

## 2010-09-03 LAB — STOOL CULTURE

## 2010-09-03 LAB — CK TOTAL AND CKMB (NOT AT ARMC)
CK, MB: 0.5 ng/mL (ref 0.3–4.0)
Relative Index: INVALID (ref 0.0–2.5)
Total CK: 73 U/L (ref 7–177)
Total CK: 80 U/L (ref 7–177)

## 2010-09-03 LAB — MAGNESIUM: Magnesium: 2.2 mg/dL (ref 1.5–2.5)

## 2010-09-03 LAB — EXPECTORATED SPUTUM ASSESSMENT W GRAM STAIN, RFLX TO RESP C

## 2010-09-03 LAB — LIPID PANEL
LDL Cholesterol: 42 mg/dL (ref 0–99)
Triglycerides: 160 mg/dL — ABNORMAL HIGH (ref <150)

## 2010-09-03 LAB — LACTIC ACID, PLASMA
Lactic Acid, Venous: 1.5 mmol/L (ref 0.5–2.2)
Lactic Acid, Venous: 6.1 mmol/L — ABNORMAL HIGH (ref 0.5–2.2)

## 2010-09-03 LAB — BLOOD GAS, ARTERIAL
Acid-base deficit: 6.2 mmol/L — ABNORMAL HIGH (ref 0.0–2.0)
Bicarbonate: 18 mEq/L — ABNORMAL LOW (ref 20.0–24.0)
Bicarbonate: 19.5 mEq/L — ABNORMAL LOW (ref 20.0–24.0)
Drawn by: 319961
O2 Content: 2 L/min
Patient temperature: 98.6
pCO2 arterial: 31.7 mmHg — ABNORMAL LOW (ref 35.0–45.0)
pH, Arterial: 7.367 (ref 7.350–7.400)
pO2, Arterial: 61.4 mmHg — ABNORMAL LOW (ref 80.0–100.0)

## 2010-09-03 LAB — HEPATIC FUNCTION PANEL
ALT: 25 U/L (ref 0–35)
Albumin: 2.9 g/dL — ABNORMAL LOW (ref 3.5–5.2)
Alkaline Phosphatase: 46 U/L (ref 39–117)
Total Protein: 6.2 g/dL (ref 6.0–8.3)

## 2010-09-03 LAB — CORTISOL: Cortisol, Plasma: 28.4 ug/dL

## 2010-09-03 LAB — CULTURE, RESPIRATORY W GRAM STAIN: Culture: NORMAL

## 2010-09-03 LAB — PROTIME-INR: INR: 1.13 (ref 0.00–1.49)

## 2010-09-03 LAB — LEGIONELLA ANTIGEN, URINE

## 2010-09-03 LAB — CARBOXYHEMOGLOBIN
Carboxyhemoglobin: 1.9 % — ABNORMAL HIGH (ref 0.5–1.5)
Methemoglobin: 0.6 % (ref 0.0–1.5)
O2 Saturation: 73 %

## 2010-09-03 LAB — AMYLASE: Amylase: 30 U/L (ref 0–105)

## 2010-09-03 LAB — FECAL LACTOFERRIN, QUANT

## 2010-09-03 LAB — ABO/RH: ABO/RH(D): B POS

## 2010-09-03 LAB — CULTURE, BLOOD (ROUTINE X 2): Culture: NO GROWTH

## 2010-09-03 LAB — PHOSPHORUS: Phosphorus: 1.6 mg/dL — ABNORMAL LOW (ref 2.3–4.6)

## 2010-09-03 LAB — D-DIMER, QUANTITATIVE: D-Dimer, Quant: 0.43 ug/mL-FEU (ref 0.00–0.48)

## 2010-09-03 LAB — LIPASE, BLOOD: Lipase: 29 U/L (ref 11–59)

## 2010-09-03 LAB — CLOSTRIDIUM DIFFICILE EIA

## 2010-09-03 LAB — PREGNANCY, URINE: Preg Test, Ur: NEGATIVE

## 2010-10-31 ENCOUNTER — Encounter: Payer: Self-pay | Admitting: Internal Medicine

## 2010-11-05 ENCOUNTER — Encounter: Payer: Self-pay | Admitting: Internal Medicine

## 2010-11-08 ENCOUNTER — Other Ambulatory Visit (INDEPENDENT_AMBULATORY_CARE_PROVIDER_SITE_OTHER): Payer: PRIVATE HEALTH INSURANCE

## 2010-11-08 ENCOUNTER — Ambulatory Visit (INDEPENDENT_AMBULATORY_CARE_PROVIDER_SITE_OTHER): Payer: PRIVATE HEALTH INSURANCE | Admitting: Internal Medicine

## 2010-11-08 ENCOUNTER — Encounter: Payer: Self-pay | Admitting: Internal Medicine

## 2010-11-08 ENCOUNTER — Other Ambulatory Visit (INDEPENDENT_AMBULATORY_CARE_PROVIDER_SITE_OTHER): Payer: PRIVATE HEALTH INSURANCE | Admitting: Internal Medicine

## 2010-11-08 VITALS — BP 110/76 | HR 98 | Temp 98.8°F | Ht 63.0 in | Wt 257.6 lb

## 2010-11-08 DIAGNOSIS — E119 Type 2 diabetes mellitus without complications: Secondary | ICD-10-CM

## 2010-11-08 DIAGNOSIS — Z23 Encounter for immunization: Secondary | ICD-10-CM

## 2010-11-08 DIAGNOSIS — Z13220 Encounter for screening for lipoid disorders: Secondary | ICD-10-CM

## 2010-11-08 DIAGNOSIS — Z0000 Encounter for general adult medical examination without abnormal findings: Secondary | ICD-10-CM

## 2010-11-08 LAB — CBC WITH DIFFERENTIAL/PLATELET
Basophils Absolute: 0.1 10*3/uL (ref 0.0–0.1)
Eosinophils Relative: 1.4 % (ref 0.0–5.0)
HCT: 43.8 % (ref 36.0–46.0)
Hemoglobin: 15.4 g/dL — ABNORMAL HIGH (ref 12.0–15.0)
Lymphocytes Relative: 30.7 % (ref 12.0–46.0)
Monocytes Relative: 5.6 % (ref 3.0–12.0)
Neutro Abs: 9.2 10*3/uL — ABNORMAL HIGH (ref 1.4–7.7)
RDW: 13.4 % (ref 11.5–14.6)
WBC: 14.8 10*3/uL — ABNORMAL HIGH (ref 4.5–10.5)

## 2010-11-08 LAB — LIPID PANEL
Cholesterol: 182 mg/dL (ref 0–200)
HDL: 47.2 mg/dL (ref >39.00)
Triglycerides: 220 mg/dL — ABNORMAL HIGH (ref 0.0–149.0)

## 2010-11-08 LAB — HEPATIC FUNCTION PANEL
ALT: 31 U/L (ref 0–35)
AST: 26 U/L (ref 0–37)
Albumin: 4.1 g/dL (ref 3.5–5.2)
Total Bilirubin: 0.3 mg/dL (ref 0.3–1.2)
Total Protein: 7.9 g/dL (ref 6.0–8.3)

## 2010-11-08 LAB — BASIC METABOLIC PANEL
Calcium: 9.5 mg/dL (ref 8.4–10.5)
GFR: 99.34 mL/min (ref >60.00)
Glucose, Bld: 243 mg/dL — ABNORMAL HIGH (ref 70–99)
Potassium: 4.3 mEq/L (ref 3.5–5.1)
Sodium: 136 mEq/L (ref 135–145)

## 2010-11-08 LAB — HEMOGLOBIN A1C: Hgb A1c MFr Bld: 8.1 % — ABNORMAL HIGH (ref 4.6–6.5)

## 2010-11-08 LAB — URINALYSIS
Leukocytes, UA: NEGATIVE
Specific Gravity, Urine: 1.025 (ref 1.000–1.030)
Urine Glucose: 250
Urobilinogen, UA: 0.2 (ref 0.0–1.0)
pH: 6.5 (ref 5.0–8.0)

## 2010-11-08 LAB — TSH: TSH: 1.79 u[IU]/mL (ref 0.35–5.50)

## 2010-11-08 NOTE — Assessment & Plan Note (Signed)
dx 10/2009 during hosp for ARDS uncontrolled by hx - weight remains issue Janumet sample and copay card provided to help with expense reminded of need for med compliance and weight loss not on ACEI due to desire for eventual pregnancy Recheck a1c now, titrate as needed Lab Results  Component Value Date   HGBA1C 9.2* 07/26/2010

## 2010-11-08 NOTE — Progress Notes (Signed)
Subjective:    Patient ID: Sheri Rasmussen, female    DOB: 23-Aug-1980, 30 y.o.   MRN: [id number]  HPI patient is here today for annual physical. Patient feels well and has no complaints. also reviewed chronic medical issues today:  DM2 - new dx during 10/2009 hospitalization -  checks sugars 1-3x/day - range 160-180s no hypoglycemia symptoms or events reports 100% compliance with oral meds  has been to education classes for same - working on diet and weight loss- unsuccessful thus far  amenorrhea - working with gynecology on same - s/p induction and BCP not sexually active - no risk pregnancy -  morbid obesity - reports diet and exercise efforts ongoing - but unsuccessful thus far - weight gain since last OV reviewed  Past Medical History  Diagnosis Date  . Morbid obesity   . SMOKER   . PNEUMONIA 10/2009    a/w ARDS/sepsis  . AMENORRHEA   . Allergy   . DIABETES MELLITUS, TYPE II    Family History  Problem Relation Age of Onset  . Diabetes Mother     gestational  . Cervical cancer Paternal Grandmother    History  Substance Use Topics  . Smoking status: Current Everyday Smoker -- 10 years    Types: Cigarettes  . Smokeless tobacco: Not on file   Comment: [place] heritage ([place]). 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 Alcohol Use:      Review of Systems  Constitutional: Negative for fever.  Respiratory: Negative for cough and shortness of breath.   Cardiovascular: Negative for chest pain.  Gastrointestinal: Negative for abdominal pain.  Musculoskeletal: Negative for gait problem.  Skin: Negative for rash.  Neurological: Negative for dizziness.  No other specific complaints in a complete review of systems (except as listed in HPI above).     Objective:   Physical Exam BP 110/76  Pulse 98  Temp(Src) 98.8 F (37.1 C) (Oral)  Ht 5\' 3"  (1.6 m)  Wt 257 lb 9.6 oz (116.847 kg)  BMI 45.63 kg/m2  SpO2  98% Wt Readings from Last 3 Encounters:  11/08/10 257 lb 9.6 oz (116.847 kg)  07/26/10 260 lb (117.935 kg)  03/15/10 248 lb 6.4 oz (112.674 kg)   Physical Exam  Constitutional: She is obese; oriented to person, place, and time. She appears well-developed and well-nourished. No distress.  HENT: Head: Normocephalic and atraumatic. Ears; B TMs ok, no erythema or effusion; Nose: Nose normal.  Mouth/Throat: Oropharynx is clear and moist. No oropharyngeal exudate.  Eyes: Conjunctivae and EOM are normal. Pupils are equal, round, and reactive to light. No scleral icterus.  Neck: Normal range of motion. Neck supple. No JVD present. No thyromegaly present.  Cardiovascular: Normal rate, regular rhythm and normal heart sounds.  No murmur heard. No BLE edema. Pulmonary/Chest: Effort normal and breath sounds normal. No respiratory distress. She has no wheezes.  Abdominal: Soft. Bowel sounds are normal. She exhibits no distension. There is no tenderness.  Musculoskeletal: Normal range of motion, no joint effusions. No gross deformities Neurological: She is alert and oriented to person, place, and time. No cranial nerve deficit. Coordination normal.  Skin: Skin is warm and dry. No rash noted. No erythema.  Psychiatric: She has a normal mood and affect. Batley behavior is normal. Judgment and thought content normal.        Assessment & Plan:  CPX - v70.0 - Patient has been counseled on age-appropriate routine health concerns  for screening and prevention. These are reviewed and up-to-date. Immunizations are up-to-date or declined. Labs ordered and will be reviewed.  Also see problem list. Medications and labs reviewed today.

## 2010-11-08 NOTE — Patient Instructions (Signed)
It was good to see you today. Exam looks good today except for the weight - keep working on lifestyle changes as discussed (low fat, low carb, increased protein diet; improved exercise efforts; weight loss) to control sugar, blood pressure and cholesterol levels and/or reduce risk of developing other medical problems. Look into LimitLaws[employer]com.cy or other type of food journal to assist you in this process. Tdap vaccine given today Test(s) ordered today. Your results will be called to you after review (48-72hours after test completion). If any changes need to be made, you will be notified at that time. Medications reviewed, no changes at this time. Samples of Janumet and coupon card given today - will look into other options if cost is an issue Please schedule followup in 3-4 months, call sooner if problems.

## 2010-11-11 NOTE — Assessment & Plan Note (Signed)
Reviewed need for weight reduction to control DM as well as other health issues Wt Readings from Last 3 Encounters:  11/08/10 257 lb 9.6 oz (116.847 kg)  07/26/10 260 lb (117.935 kg)  03/15/10 248 lb 6.4 oz (112.674 kg)

## 2010-11-12 LAB — LDL CHOLESTEROL, DIRECT: Direct LDL: 124 mg/dL

## 2011-01-12 ENCOUNTER — Other Ambulatory Visit: Payer: Self-pay | Admitting: Internal Medicine

## 2011-02-14 ENCOUNTER — Other Ambulatory Visit: Payer: Self-pay | Admitting: Internal Medicine

## 2011-02-26 ENCOUNTER — Other Ambulatory Visit (INDEPENDENT_AMBULATORY_CARE_PROVIDER_SITE_OTHER): Payer: BC Managed Care – PPO

## 2011-02-26 ENCOUNTER — Encounter: Payer: Self-pay | Admitting: *Deleted

## 2011-02-26 ENCOUNTER — Ambulatory Visit (INDEPENDENT_AMBULATORY_CARE_PROVIDER_SITE_OTHER): Payer: BC Managed Care – PPO | Admitting: Internal Medicine

## 2011-02-26 ENCOUNTER — Encounter: Payer: Self-pay | Admitting: Internal Medicine

## 2011-02-26 VITALS — BP 110/76 | HR 91 | Temp 98.8°F | Ht 63.0 in | Wt 252.8 lb

## 2011-02-26 DIAGNOSIS — E119 Type 2 diabetes mellitus without complications: Secondary | ICD-10-CM

## 2011-02-26 LAB — HEMOGLOBIN A1C: Hgb A1c MFr Bld: 7.3 % — ABNORMAL HIGH (ref 4.6–6.5)

## 2011-02-26 NOTE — Patient Instructions (Addendum)
It was good to see you today. Test(s) ordered today. Your results will be called to you after review (48-72hours after test completion). If any changes need to be made, you will be notified at that time. Medications reviewed, no changes at this time. School note provided today Good job on the weight loss - keep up the good work!! Please schedule followup in 3-4 months for diabetes mellitus, call sooner if problems.

## 2011-02-26 NOTE — Assessment & Plan Note (Signed)
Reviewed need for weight reduction to control DM as well as other health issues encouragement provided to continue same efforts and commended on success Wt Readings from Last 3 Encounters:  02/26/11 252 lb 12.8 oz (114.669 kg)  11/08/10 257 lb 9.6 oz (116.847 kg)  07/26/10 260 lb (117.935 kg)

## 2011-02-26 NOTE — Progress Notes (Signed)
  Subjective:    Patient ID: Sheri Rasmussen, female    DOB: [birth year], 30 y.o.   MRN: [id number]  HPI  Here for follow up -reviewed chronic medical issues today:  DM2 - new dx during 10/2009 hospitalization -  checks sugars 1-3x/day - range 160-180s no hypoglycemia symptoms or events reports 100% compliance with oral meds  has been to education classes for same - working on diet and weight loss- cardio exercise  amenorrhea - working with gynecology on same - s/p induction and BCP not sexually active - no risk pregnancy -  morbid obesity - reports diet and exercise efforts ongoing -   Past Medical History  Diagnosis Date  . Morbid obesity   . SMOKER   . PNEUMONIA 10/2009    a/w ARDS/sepsis  . AMENORRHEA   . Allergy   . DIABETES MELLITUS, TYPE II    Family History  Problem Relation Age of Onset  . Diabetes Mother     gestational  . Cervical cancer Paternal Grandmother    History  Substance Use Topics  . Smoking status: Current Everyday Smoker -- 10 years    Types: Cigarettes  . Smokeless tobacco: Not on file   Comment: [place] heritage ([place]). 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 Alcohol Use:      Review of Systems Constitutional: Negative for fever or weight change.  Respiratory: Negative for cough and shortness of breath.   Cardiovascular: Negative for chest pain.     Objective:   Physical Exam  BP 110/76  Pulse 91  Temp(Src) 98.8 F (37.1 C) (Oral)  Ht 5\' 3"  (1.6 m)  Wt 252 lb 12.8 oz (114.669 kg)  BMI 44.78 kg/m2  SpO2 97% Wt Readings from Last 3 Encounters:  02/26/11 252 lb 12.8 oz (114.669 kg)  11/08/10 257 lb 9.6 oz (116.847 kg)  07/26/10 260 lb (117.935 kg)    Constitutional: She is obese; oriented to person, place, and time. She appears well-developed and well-nourished. No distress. Neck: Normal range of motion. Neck supple. No JVD present. No thyromegaly present.    Cardiovascular: Normal rate, regular rhythm and normal heart sounds.  No murmur heard. No BLE edema. Pulmonary/Chest: Effort normal and breath sounds normal. No respiratory distress. She has no wheezes. Psychiatric: She has a normal mood and affect. Roots behavior is normal. Judgment and thought content normal.   Lab Results  Component Value Date   WBC 14.8* 11/08/2010   HGB 15.4* 11/08/2010   HCT 43.8 11/08/2010   PLT 253.0 11/08/2010   CHOL 182 11/08/2010   TRIG 220.0* 11/08/2010   HDL 47.20 11/08/2010   LDLDIRECT 124.0 11/08/2010   ALT 31 11/08/2010   AST 26 11/08/2010   NA 136 11/08/2010   K 4.3 11/08/2010   CL 99 11/08/2010   CREATININE 0.7 11/08/2010   BUN 18 11/08/2010   CO2 28 11/08/2010   TSH 1.79 11/08/2010   INR 1.13 10/25/2009   HGBA1C 8.1* 11/08/2010   MICROALBUR 0.4 03/15/2010        Assessment & Plan:   see problem list. Medications and labs reviewed today.

## 2011-02-26 NOTE — Assessment & Plan Note (Signed)
dx 10/2009 during hosp for ARDS uncontrolled by hx - weight remains issue Janumet and amaryl reminded of need for med compliance and weight loss not on ACEI due to desire for eventual pregnancy Recheck a1c now, titrate as needed Lab Results  Component Value Date   HGBA1C 8.1* 11/08/2010

## 2011-03-18 ENCOUNTER — Telehealth: Payer: Self-pay

## 2011-03-18 DIAGNOSIS — H9209 Otalgia, unspecified ear: Secondary | ICD-10-CM

## 2011-03-18 NOTE — Telephone Encounter (Signed)
Pt called requesting referral to ENT for evaluation of persistent ear infections.

## 2011-03-19 NOTE — Telephone Encounter (Signed)
Pt informed and will expect a call from [facility] with appt info 

## 2011-03-19 NOTE — Telephone Encounter (Signed)
done

## 2011-03-27 ENCOUNTER — Telehealth: Payer: Self-pay | Admitting: Internal Medicine

## 2011-03-27 NOTE — Telephone Encounter (Signed)
Received copies from [facility], Nose, [facility] Thro[facility],on 03/27/11. Forwarded  2pages to Dr. Morrison Old review.

## 2011-07-02 ENCOUNTER — Encounter: Payer: Self-pay | Admitting: Internal Medicine

## 2011-07-02 ENCOUNTER — Other Ambulatory Visit (INDEPENDENT_AMBULATORY_CARE_PROVIDER_SITE_OTHER): Payer: BC Managed Care – PPO

## 2011-07-02 ENCOUNTER — Ambulatory Visit (INDEPENDENT_AMBULATORY_CARE_PROVIDER_SITE_OTHER): Payer: BC Managed Care – PPO | Admitting: Internal Medicine

## 2011-07-02 DIAGNOSIS — E119 Type 2 diabetes mellitus without complications: Secondary | ICD-10-CM

## 2011-07-02 LAB — HEMOGLOBIN A1C: Hgb A1c MFr Bld: 8.7 % — ABNORMAL HIGH (ref 4.6–6.5)

## 2011-07-02 NOTE — Assessment & Plan Note (Signed)
Reviewed need for weight reduction to control DM as well as other health issues encouragement provided to continue same efforts and commended on prior success Wt Readings from Last 3 Encounters:  07/02/11 252 lb (114.306 kg)  02/26/11 252 lb 12.8 oz (114.669 kg)  11/08/10 257 lb 9.6 oz (116.847 kg)

## 2011-07-02 NOTE — Patient Instructions (Addendum)
It was good to see you today. Test(s) ordered today. Your results will be called to you after review (48-72hours after test completion). If any changes need to be made, you will be notified at that time. Medications reviewed, no changes at this time.Continue to focus on need for weight loss - keep up the good work!! Please schedule followup in 3-4 months for diabetes mellitus, call sooner if problems. You should get a flu shot!!!

## 2011-07-02 NOTE — Progress Notes (Signed)
  Subjective:    Patient ID: Sheri Rasmussen, female    DOB: 04-28-1981, 31 y.o.   MRN: [id number]  HPI  Here for follow up -reviewed chronic medical issues today:  DM2 - new dx during 10/2009 hospitalization -  checks sugars 1-3x/day - range 160-180s no hypoglycemia symptoms or events reports 100% compliance with oral meds  has been to education classes for same - working on diet and weight loss- cardio exercise  amenorrhea - working with gynecology on same - s/p induction and BCP not sexually active - no risk pregnancy -  morbid obesity - reports diet and exercise efforts ongoing -   Past Medical History  Diagnosis Date  . Morbid obesity   . SMOKER   . PNEUMONIA 10/2009    a/w ARDS/sepsis  . AMENORRHEA   . Allergy   . DIABETES MELLITUS, TYPE II     Review of Systems Constitutional: Negative for fever or weight change.  Respiratory: Negative for cough and shortness of breath.   Cardiovascular: Negative for chest pain.     Objective:   Physical Exam  BP 110/62  Pulse 97  Temp(Src) 98.3 F (36.8 C) (Oral)  Wt 252 lb (114.306 kg)  SpO2 97% Wt Readings from Last 3 Encounters:  07/02/11 252 lb (114.306 kg)  02/26/11 252 lb 12.8 oz (114.669 kg)  11/08/10 257 lb 9.6 oz (116.847 kg)   Constitutional: She is obese but appears well-developed and well-nourished. No distress. Neck: Normal range of motion. Neck supple. No JVD present. No thyromegaly present.  Cardiovascular: Normal rate, regular rhythm and normal heart sounds.  No murmur heard. No BLE edema. Pulmonary/Chest: Effort normal and breath sounds normal. No respiratory distress. She has no wheezes. Psychiatric: She has a normal mood and affect. [place] behavior is normal. Judgment and thought content normal.   Lab Results  Component Value Date   WBC 14.8* 11/08/2010   HGB 15.4* 11/08/2010   HCT 43.8 11/08/2010   PLT 253.0 11/08/2010   CHOL 182 11/08/2010   TRIG 220.0* 11/08/2010   HDL 47.20 11/08/2010   LDLDIRECT 124.0  11/08/2010   ALT 31 11/08/2010   AST 26 11/08/2010   NA 136 11/08/2010   K 4.3 11/08/2010   CL 99 11/08/2010   CREATININE 0.7 11/08/2010   BUN 18 11/08/2010   CO2 28 11/08/2010   TSH 1.79 11/08/2010   INR 1.13 10/25/2009   HGBA1C 7.3* 02/26/2011   MICROALBUR 0.4 03/15/2010        Assessment & Plan:   see problem list. Medications and labs reviewed today.

## 2011-07-02 NOTE — Assessment & Plan Note (Signed)
dx 10/2009 during hosp for ARDS uncontrolled by hx - weight remains issue Janumet and amaryl reminded of need for med compliance and weight loss not on ACEI due to desire for eventual pregnancy Recheck a1c now, titrate as needed Lab Results  Component Value Date   HGBA1C 7.3* 02/26/2011

## 2011-07-03 ENCOUNTER — Telehealth: Payer: Self-pay | Admitting: Internal Medicine

## 2011-07-03 NOTE — Telephone Encounter (Signed)
Called pt no answer [facility] concerning labs.Marland KitchenMarland Kitchen1/17/13@1 :29pm/LMB

## 2011-07-03 NOTE — Telephone Encounter (Signed)
Pt return call back gave results & md recommendations  Concerning labs.Marland KitchenMarland Kitchen1/17/13@1 :40pm/LMB

## 2011-07-03 NOTE — Telephone Encounter (Signed)
Please let pt knoe Arif a1c is less well controlled than before - now 8.7 Lab Results  Component Value Date   HGBA1C 8.7* 07/02/2011   Will refer to endo ([facility]) for review and med adjustment, esp given pt desire for pregnancy in near future - thanks

## 2011-07-11 ENCOUNTER — Ambulatory Visit: Payer: BC Managed Care – PPO | Admitting: Endocrinology

## 2011-07-18 ENCOUNTER — Other Ambulatory Visit: Payer: Self-pay | Admitting: *Deleted

## 2011-07-18 MED ORDER — SITAGLIPTIN PHOS-METFORMIN HCL 50-1000 MG PO TABS: 1.0000 | ORAL_TABLET | Freq: Two times a day (BID) | ORAL | Status: DC

## 2011-07-25 ENCOUNTER — Encounter: Payer: Self-pay | Admitting: Endocrinology

## 2011-07-25 ENCOUNTER — Ambulatory Visit (INDEPENDENT_AMBULATORY_CARE_PROVIDER_SITE_OTHER): Payer: BC Managed Care – PPO | Admitting: Endocrinology

## 2011-07-25 NOTE — Patient Instructions (Addendum)
cc: C Curtis (phys for women) good diet and exercise habits significanly improve the control of your diabetes.  please let me know if you wish to be referred to a dietician, or for weight-loss surgery.  high blood sugar is very risky to your health.  you should see an eye doctor every year. controlling your blood pressure and cholesterol drastically reduces the damage diabetes does to your body.  this also applies to quitting smoking.  please discuss these with your doctor.  you should take an aspirin every day, unless you have been advised by a doctor not to. check your blood sugar 2 times a day.  vary the time of day when you check, between before the 3 meals, and at bedtime.  also check if you have symptoms of your blood sugar being too high or too low.  please keep a record of the readings and bring it to your next appointment here.  please call us sooner if your blood sugar goes below 70, or if it stays over 200.   For now, start humalog pen 3 units 3x a day (just before each meal).   Continue the diabetes pills for now Please come back for a follow-up appointment in 2 weeks.

## 2011-07-25 NOTE — Progress Notes (Signed)
Subjective:    Patient ID: Sheri Rasmussen, female    DOB: [birth year], 31 y.o.   MRN: [id number]  HPI pt states 1-2 years h/o dm.  she is unaware of any chronic complications.  she has never been on insulin.  pt says his diet and exercise are both much improved recently. She reports few years of intermittent moderate headache, throughout the head, but no assoc visual loss. Past Medical History  Diagnosis Date  . Morbid obesity   . SMOKER   . PNEUMONIA 10/2009    a/w ARDS/sepsis  . AMENORRHEA   . Allergy   . DIABETES MELLITUS, TYPE II     No past surgical history on file.  History   Social History  . Marital Status: Single    Spouse Name: N/A    Number of Children: N/A  . Years of Education: N/A   Occupational History  . Not on file.   Social History Main Topics  . Smoking status: Current Everyday Smoker -- 10 years    Types: Cigarettes  . Smokeless tobacco: Not on file   Comment: Hmong heritage ([place]). 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 Alcohol Use:   . Drug Use: No  . Sexually Active:    Other Topics Concern  . Not on file   Social History Narrative  . No narrative on file    Current Outpatient Prescriptions on File Prior to Visit  Medication Sig Dispense Refill  . glimepiride (AMARYL) 2 MG tablet TAKE ONE TABLET BY MOUTH TWICE DAILY - HOLD IF SUGAR IS LESS THAN 100  180 tablet  1  . sitaGLIPtan-metformin (JANUMET) 50-1000 MG per tablet Take 1 tablet by mouth 2 (two) times daily with a meal.  180 tablet  2    No Known Allergies  Family History  Problem Relation Age of Onset  . Diabetes Mother     gestational  . Cervical cancer Paternal Grandmother     BP 116/72  Pulse 89  Temp(Src) 97.1 F (36.2 C) (Oral)  Ht 5\' 3"  (1.6 m)  Wt 253 lb (114.76 kg)  BMI 44.82 kg/m2  SpO2 97%  LMP 07/20/2011    Review of Systems denies weight loss, chest pain, sob, n/v, urinary frequency, cramps,  excessive diaphoresis, memory loss, depression, hypoglycemia, rhinorrhea, and easy bruising.  menses have not resumed since discontinuation of oral contraceptive 3 mos ago.  She reports chronically irreg menses, prior to the oral contraceptive.    Objective:   Physical Exam VS: see vs page GEN: no distress.  obese HEAD: head: no deformity eyes: no periorbital swelling, no proptosis external nose and ears are normal mouth: no lesion seen NECK: supple, thyroid is not enlarged CHEST WALL: no deformity LUNGS:  Clear to auscultation CV: reg rate and rhythm, no murmur ABD: abdomen is soft, nontender.  no hepatosplenomegaly.  not distended.  no hernia GENITALIA:  Normal external female.  Normal bimanual exam RECTAL: normal external and internal exam.  heme neg MUSCULOSKELETAL: muscle bulk and strength are grossly normal.  no obvious joint swelling.  gait is normal and steady EXTEMITIES: no deformity.  no ulcer on the feet.  feet are of normal color and temp.  no edema PULSES: dorsalis pedis intact bilat.  no carotid bruit NEURO:  cn 2-12 grossly intact.   readily moves all 4's.  sensation is intact to touch on the feet SKIN:  Normal texture and temperature.  No rash  or suspicious lesion is visible.   NODES:  None palpable at the neck PSYCH: alert, oriented x3.  Does not appear anxious nor depressed.  Lab Results  Component Value Date   HGBA1C 8.7* 07/02/2011      Assessment & Plan:  DM, needs increased rx Pre-pregnancy state.  She needs aggressive control on insulin, as hyperglycemia causes high risk to a future pregnancy Hypomenorrhea.  This may benefit from weight-loss surgery Headache, uncertain etiology (i demonstrated insulin pen to pt today)

## 2011-08-11 ENCOUNTER — Telehealth: Payer: Self-pay | Admitting: *Deleted

## 2011-08-11 ENCOUNTER — Ambulatory Visit: Payer: BC Managed Care – PPO | Admitting: Endocrinology

## 2011-08-11 NOTE — Telephone Encounter (Signed)
Completed as requested

## 2011-08-11 NOTE — Telephone Encounter (Signed)
Completed form place on md desk to sign... 08/11/11@3 :46pm/LMB

## 2011-08-11 NOTE — Telephone Encounter (Signed)
Requesting md to fill-out health insurance form and fax back. Need to be turn in by 08/14/11.Marland KitchenMarland Kitchen2/25/13@3 :37pm/LMB

## 2011-08-12 NOTE — Telephone Encounter (Signed)
Inform pt form ready for pick-up per pt wanted fax bck to [phone]... 08/12/11@9 :38am/LMB

## 2011-08-14 ENCOUNTER — Ambulatory Visit: Payer: BC Managed Care – PPO | Admitting: Endocrinology

## 2011-08-28 ENCOUNTER — Ambulatory Visit: Payer: BC Managed Care – PPO | Admitting: Endocrinology

## 2011-09-04 IMAGING — CR DG CHEST 2V
2 series · 2 of 2 positions shown · non-contrast
Comparison: 10/28/2009

CLINICAL DATA: Follow-up pneumonia.

CHEST - 2 VIEW

[view not recorded (1 of 2)]
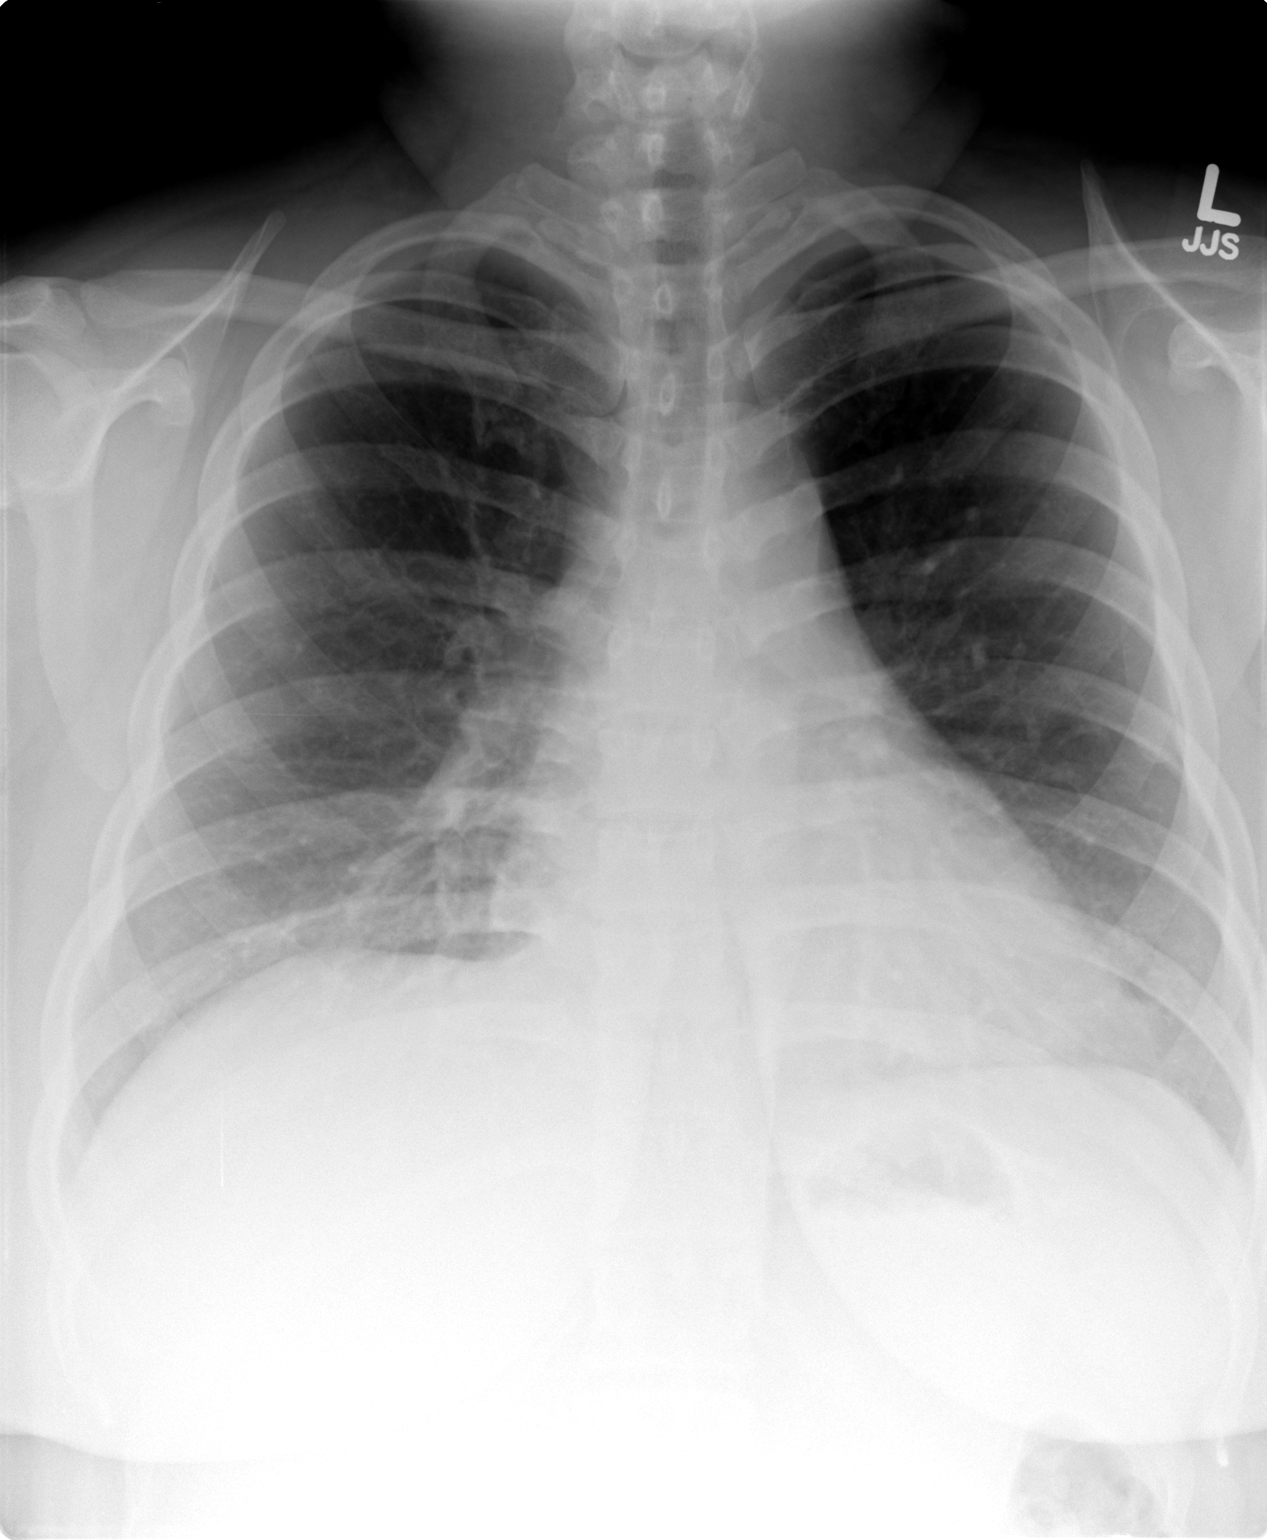

[view not recorded (2 of 2)]
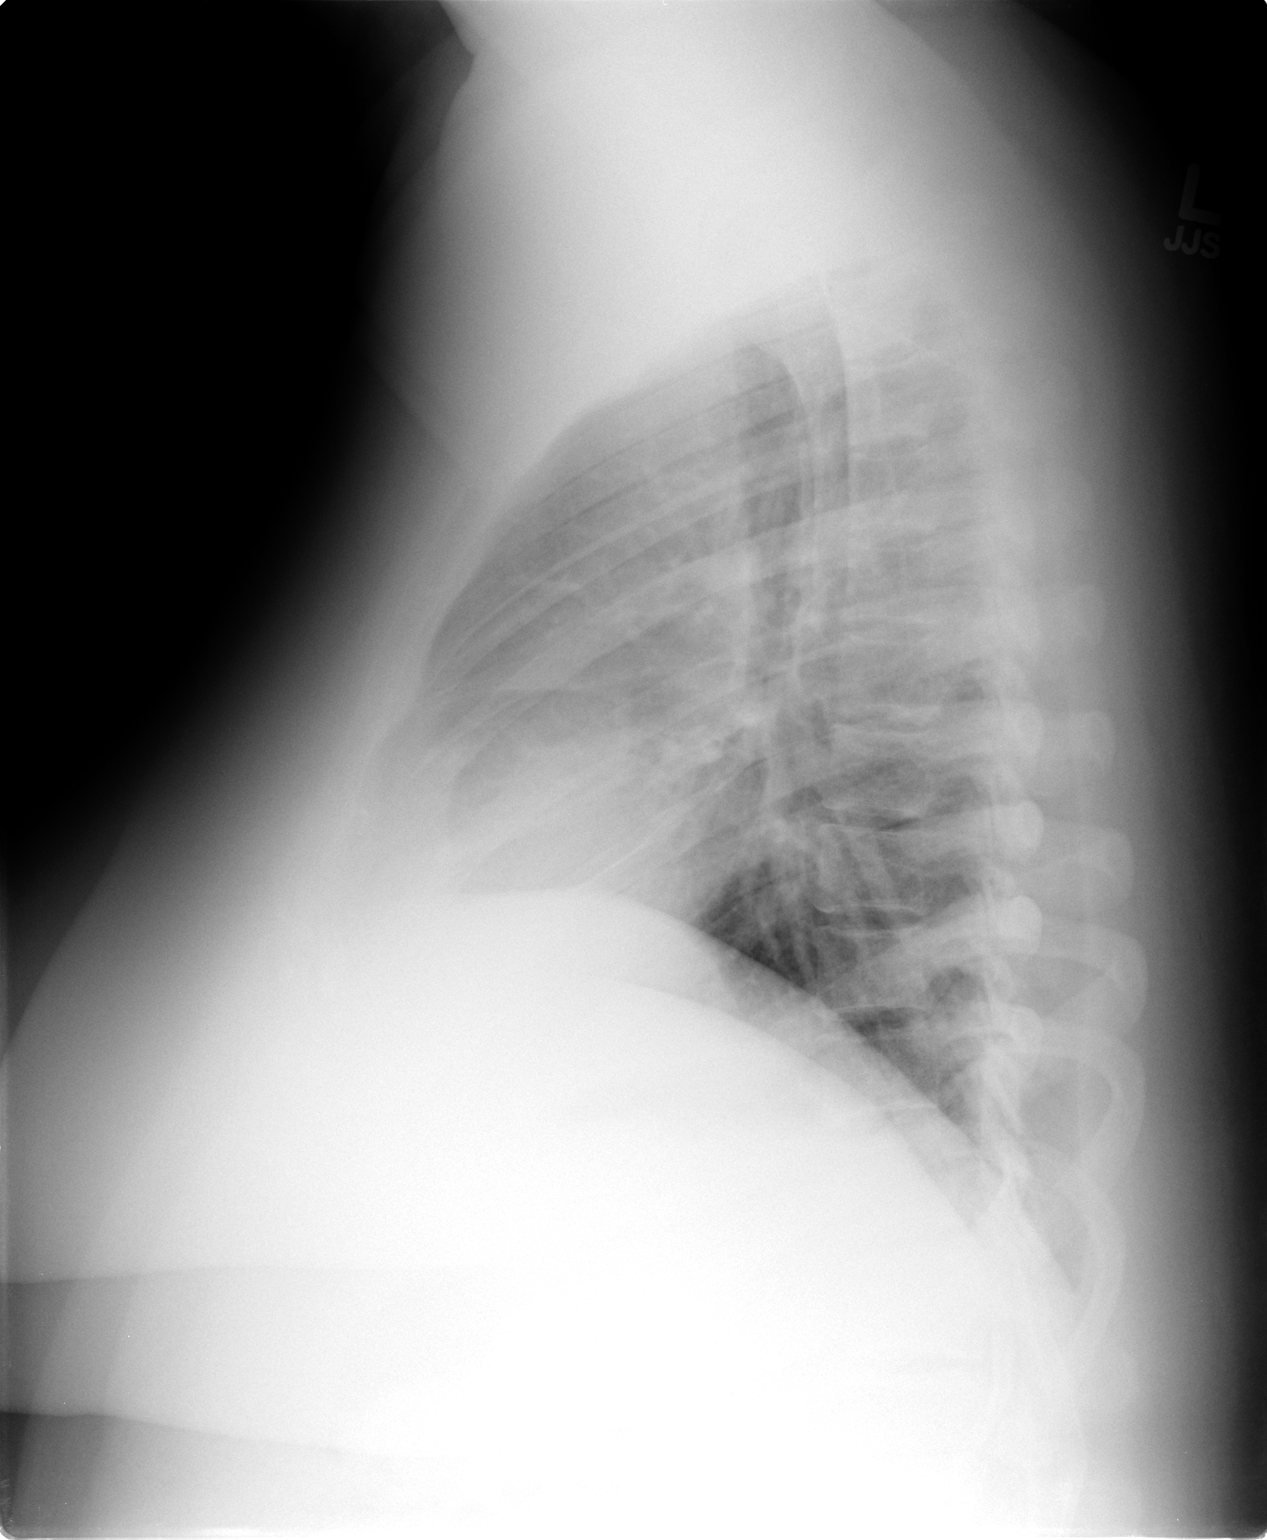

[2 of 2 positions shown; findings below may reference images not displayed]

FINDINGS: Interval resolution of right middle and lower lobe
infiltrates is seen since prior study.  There is no evidence of
pleural effusion.  Heart size and mediastinal contours are normal.
IMPRESSION: Resolution of right midlung lower lobe infiltrates.  No active
disease.

## 2011-09-23 ENCOUNTER — Ambulatory Visit (INDEPENDENT_AMBULATORY_CARE_PROVIDER_SITE_OTHER): Payer: BC Managed Care – PPO | Admitting: Endocrinology

## 2011-09-23 ENCOUNTER — Encounter: Payer: Self-pay | Admitting: Endocrinology

## 2011-09-23 VITALS — BP 110/70 | HR 111 | Temp 98.6°F | Resp 16 | Ht 63.0 in | Wt 254.0 lb

## 2011-09-23 DIAGNOSIS — IMO0001 Reserved for inherently not codable concepts without codable children: Secondary | ICD-10-CM

## 2011-09-23 NOTE — Progress Notes (Signed)
  Subjective:    Patient ID: Sheri Rasmussen, female    DOB: 03/13/1981, 31 y.o.   MRN: [id number]  HPI Pt returns for f/u of insulin-requiring DM (2011).  pt states she feels well in general.  no cbg record, but states cbg's are well-controlled.  It has been slightly low a few times. Past Medical History  Diagnosis Date  . Morbid obesity   . SMOKER   . PNEUMONIA 10/2009    a/w ARDS/sepsis  . AMENORRHEA   . Allergy   . DIABETES MELLITUS, TYPE II     No past surgical history on file.  History   Social History  . Marital Status: Single    Spouse Name: N/A    Number of Children: N/A  . Years of Education: N/A   Occupational History  . Not on file.   Social History Main Topics  . Smoking status: Current Everyday Smoker -- 10 years    Types: Cigarettes  . Smokeless tobacco: Not on file   Comment: Hmong heritage ([place]). 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 Alcohol Use:   . Drug Use: No  . Sexually Active:    Other Topics Concern  . Not on file   Social History Narrative  . No narrative on file    Current Outpatient Prescriptions on File Prior to Visit  Medication Sig Dispense Refill  . Insulin Lispro, Human, (HUMALOG KWIKPEN [place]) Inject 3 Units into the skin 3 (three) times daily. (just before each meal)      . sitaGLIPtan-metformin (JANUMET) 50-1000 MG per tablet Take 1 tablet by mouth 2 (two) times daily with a meal.  180 tablet  2    No Known Allergies  Family History  Problem Relation Age of Onset  . Diabetes Mother     gestational  . Cervical cancer Paternal Grandmother     BP 110/70  Pulse 111  Temp(Src) 98.6 F (37 C) (Oral)  Resp 16  Ht 5\' 3"  (1.6 m)  Wt 254 lb (115.214 kg)  BMI 44.99 kg/m2  SpO2 96%  LMP 07/23/2011   Review of Systems Denies loc    Objective:   Physical Exam VITAL SIGNS:  See vs page GENERAL: no distress PSYCH: Alert and oriented x 3.  Does not appear  anxious nor depressed.     Assessment & Plan:  DM.  She is ready to continue the transition from orals to insulin

## 2011-09-23 NOTE — Patient Instructions (Addendum)
Stop the glimepiride. It is expected that you blood sugar will go up.  Increase the humalog to 7 units 3x a day (just before each meal), then 10 units, 15, 20 units 3x a day, until the blood sugar returns to the low-100's.  Then stop the janumet.  i think you would then need to increase the humalog further.  Please call us if you have any question about this.   Please come back for a follow-up appointment in 6 weeks.

## 2011-09-26 ENCOUNTER — Encounter (HOSPITAL_COMMUNITY): Payer: Self-pay | Admitting: Emergency Medicine

## 2011-09-26 ENCOUNTER — Emergency Department (HOSPITAL_COMMUNITY)
Admission: EM | Admit: 2011-09-26 | Discharge: 2011-09-26 | Disposition: A | Discharge status: Home / Self Care | Payer: BC Managed Care – PPO | Source: Home / Self Care | Attending: Family Medicine | Admitting: Family Medicine

## 2011-09-26 DIAGNOSIS — L03221 Cellulitis of neck: Secondary | ICD-10-CM

## 2011-09-26 MED ORDER — DICLOFENAC SODIUM 75 MG PO TBEC: 75.0000 mg | DELAYED_RELEASE_TABLET | Freq: Two times a day (BID) | ORAL | Status: DC

## 2011-09-26 MED ORDER — DOXYCYCLINE HYCLATE 100 MG PO CAPS: 100.0000 mg | ORAL_CAPSULE | Freq: Two times a day (BID) | ORAL | Status: AC

## 2011-09-26 NOTE — ED Provider Notes (Signed)
Medical screening examination/treatment/procedure(s) were performed by non-physician practitioner and as supervising physician I was immediately available for consultation/collaboration.   [facility]; MD   Sharin Grave, MD 09/26/11 2106

## 2011-09-26 NOTE — Discharge Instructions (Signed)
Cellulitis Cellulitis is an infection of the skin and the tissue beneath it. The area is typically red and tender. It is caused by germs (bacteria) (usually staph or strep) that enter the body through cuts or sores. Cellulitis most commonly occurs in the arms or lower legs.  HOME CARE INSTRUCTIONS   If you are given a prescription for medications which kill germs (antibiotics), take as directed until finished.   If the infection is on the arm or leg, keep the limb elevated as able.   Use a warm cloth several times per day to relieve pain and encourage healing.   See your caregiver for recheck of the infected site as directed if problems arise.   Only take over-the-counter or prescription medicines for pain, discomfort, or fever as directed by your caregiver.  SEEK MEDICAL CARE IF:   The area of redness (inflammation) is spreading, there are red streaks coming from the infected site, or if a part of the infection begins to turn dark in color.   The joint or bone underneath the infected skin becomes painful after the skin has healed.   The infection returns in the same or another area after it seems to have gone away.   A boil or bump swells up. This may be an abscess.   New, unexplained problems such as pain or fever develop.  SEEK IMMEDIATE MEDICAL CARE IF:   You have a fever.   You or your child feels drowsy or lethargic.   There is vomiting, diarrhea, or lasting discomfort or feeling ill (malaise) with muscle aches and pains.  MAKE SURE YOU:   Understand these instructions.   Will watch your condition.   Will get help right away if you are not doing well or get worse.  Document Released: 03/12/2005 Document Revised: 05/22/2011 Document Reviewed: 08/05/  Abscess Care After An abscess (also called a boil or furuncle) is an infected area that contains a collection of pus. Signs and symptoms of an abscess include pain, tenderness, redness, or hardness, or you may feel a  moveable soft area under your skin. An abscess can occur anywhere in the body. The infection may spread to surrounding tissues causing cellulitis. A cut (incision) by the surgeon was made over your abscess and the pus was drained out. Gauze may have been packed into the space to provide a drain that will allow the cavity to heal from the inside outwards. The boil may be painful for 5 to 7 days. Most people with a boil do not have high fevers. Your abscess, if seen early, may not have localized, and may not have been lanced. If not, another appointment may be required for this if it does not get better on its own or with medications. HOME CARE INSTRUCTIONS   Only take over-the-counter or prescription medicines for pain, discomfort, or fever as directed by your caregiver.   When you bathe, soak and then remove gauze or iodoform packs at least daily or as directed by your caregiver. You may then wash the wound gently with mild soapy water. Repack with gauze or do as your caregiver directs.  SEEK IMMEDIATE MEDICAL CARE IF:   You develop increased pain, swelling, redness, drainage, or bleeding in the wound site.   You develop signs of generalized infection including muscle aches, chills, fever, or a general ill feeling.   An oral temperature above 102 F (38.9 C) develops, not controlled by medication.  See your caregiver for a recheck if you develop  any of the symptoms described above. If medications (antibiotics) were prescribed, take them as directed. Document Released: 12/19/2004 Document Revised: 05/22/2011 Document Reviewed: 08/16/2007 ExitCare Patient Information 2012 [employer]2009 [facility] Patient Information 2012 [place], [place].

## 2011-09-26 NOTE — ED Provider Notes (Signed)
History     CSN: [id number]  Arrival date & time 09/26/11  1623   First MD Initiated Contact with Patient 09/26/11 1721      5:43 PM HPI Sheri Rasmussen is a 31 y.o. female complaining of a recurrent abscess on [place] left neck. States area has been increasing in size for the last 2 days and is very painful. Denies fever or drainage. Reports having this area lanced before.  Patient is a 31 y.o. female presenting with abscess. The history is provided by the patient.  Abscess  This is a new problem. The current episode started less than one week ago. The onset was gradual. The problem occurs continuously. The problem has been gradually worsening. Affected Location: left neck. The problem is moderate. The abscess is characterized by redness, peeling and painfulness. It is unknown what she was exposed to. Pertinent negatives include no fever, no diarrhea, no vomiting, no rhinorrhea and no cough.    Past Medical History  Diagnosis Date  . Morbid obesity   . SMOKER   . PNEUMONIA 10/2009    a/w ARDS/sepsis  . AMENORRHEA   . Allergy   . DIABETES MELLITUS, TYPE II     History reviewed. No pertinent past surgical history.  Family History  Problem Relation Age of Onset  . Diabetes Mother     gestational  . Cervical cancer Paternal Grandmother     History  Substance Use Topics  . Smoking status: Current Everyday Smoker -- 10 years    Types: Cigarettes  . Smokeless tobacco: Not on file   Comment: Hmong heritage ([place]). 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 Alcohol Use: Yes    OB History    Grav Para Term Preterm Abortions TAB SAB Ect Mult Living                  Review of Systems  Constitutional: Negative for fever and chills.  HENT: Negative for rhinorrhea.   Eyes: Negative for redness.  Respiratory: Negative for cough, shortness of breath and wheezing.   Cardiovascular: Negative for chest pain and palpitations.   Gastrointestinal: Negative for nausea, vomiting and diarrhea.  Skin:       Abscess  All other systems reviewed and are negative.    Allergies  Review of patient's allergies indicates no known allergies.  Home Medications   Current Outpatient Rx  Name Route Sig Dispense Refill  . HUMALOG KWIKPEN [place] Subcutaneous Inject 3 Units into the skin 3 (three) times daily. (just before each meal)    . SITAGLIPTIN-METFORMIN HCL 50-1000 MG PO TABS Oral Take 1 tablet by mouth 2 (two) times daily with a meal. 180 tablet 2    BP 137/83  Pulse 98  Temp(Src) 98.3 F (36.8 C) (Oral)  Resp 20  SpO2 98%  LMP 07/23/2011  Physical Exam  Vitals reviewed. Constitutional: She is oriented to person, place, and time. Vital signs are normal. She appears well-developed and well-nourished. No distress.  HENT:  Head: Normocephalic and atraumatic.  Eyes: Pupils are equal, round, and reactive to light.  Neck: Neck supple.  Pulmonary/Chest: Effort normal.  Neurological: She is alert and oriented to person, place, and time.  Skin: Skin is warm and dry. No rash noted. No erythema. No pallor.       Large 2 cm indurated erythematous region on left posterior neck. TTP. Area in not fluctuant but appears to have white purulent drainage just  under the skin.  Psychiatric: She has a normal mood and affect. [place] behavior is normal.    ED Course  INCISION AND DRAINAGE Performed by: Thomasene Lot Authorized by: Thomasene Lot Consent: Verbal consent obtained. Consent given by: patient Patient understanding: patient states understanding of the procedure being performed Site marked: the operative site was not marked Imaging studies: imaging studies not available Patient identity confirmed: verbally with patient Time out: Immediately prior to procedure a "time out" was called to verify the correct patient, procedure, equipment, support staff and site/side marked as required. Type: abscess Body area:  head/neck Location details: neck Anesthesia: local infiltration Local anesthetic: lidocaine 1% with epinephrine Anesthetic total: 3 ml Patient sedated: no Scalpel size: 11 Incision type: single straight Complexity: simple Drainage: bloody Drainage amount: scant Packing material: 1/4 in iodoform gauze Patient tolerance: Patient tolerated the procedure well with no immediate complications.     MDM   Given doxycycline and diclofenac. Advised packing removal in 3 days. Patient voces understanding and is ready for d/c       Thomasene Lot, PA-C 09/26/11 1812

## 2011-09-26 NOTE — ED Notes (Signed)
PT HERE WITH C/O LEFT NECK GOLF BALL SIZE CYST THAT APPEARED X 2 DYS AGO.PT STATES IT HAS BEEN DRAINING POST WARM COMPRESSES BUT THE PAIN HAS WORSENED.NO FEVER,CHILLS, ASSOCIATED WITH SX

## 2011-10-29 ENCOUNTER — Encounter: Payer: Self-pay | Admitting: Internal Medicine

## 2011-10-29 ENCOUNTER — Ambulatory Visit (INDEPENDENT_AMBULATORY_CARE_PROVIDER_SITE_OTHER): Payer: BC Managed Care – PPO | Admitting: Internal Medicine

## 2011-10-29 ENCOUNTER — Other Ambulatory Visit (INDEPENDENT_AMBULATORY_CARE_PROVIDER_SITE_OTHER): Payer: BC Managed Care – PPO

## 2011-10-29 VITALS — BP 120/72 | HR 108 | Temp 98.5°F | Ht 63.0 in | Wt 251.0 lb

## 2011-10-29 DIAGNOSIS — F17200 Nicotine dependence, unspecified, uncomplicated: Secondary | ICD-10-CM

## 2011-10-29 DIAGNOSIS — IMO0001 Reserved for inherently not codable concepts without codable children: Secondary | ICD-10-CM

## 2011-10-29 LAB — HEMOGLOBIN A1C: Hgb A1c MFr Bld: 7.7 % — ABNORMAL HIGH (ref 4.6–6.5)

## 2011-10-29 LAB — MICROALBUMIN / CREATININE URINE RATIO
Creatinine,U: 148.5 mg/dL
Microalb, Ur: 2.6 mg/dL — ABNORMAL HIGH (ref 0.0–1.9)

## 2011-10-29 MED ORDER — SITAGLIPTIN PHOS-METFORMIN HCL 50-1000 MG PO TABS: 1.0000 | ORAL_TABLET | Freq: Two times a day (BID) | ORAL | Status: DC

## 2011-10-29 NOTE — Assessment & Plan Note (Signed)
5 minutes today spent counseling patient on unhealthy effects of continued tobacco abuse and encouragement of cessation including medical options available to help the patient quit smoking. 

## 2011-10-29 NOTE — Patient Instructions (Signed)
It was good to see you today. Test(s) ordered today. Your results will be called to you after review (48-72hours after test completion). If any changes need to be made, you will be notified at that time. Medications reviewed, no changes at this time. Work on lifestyle changes as discussed (low fat, low carb, increased protein diet; improved exercise efforts; weight loss) to control sugar, blood pressure and cholesterol levels and/or reduce risk of developing other medical problems. Look into [employer]cy or other type of food journal to assist you in this process. Continue to work with Dr Everardo All on your diabetes mellitus  Please schedule followup in 4-6 months for medical physical and labs, call sooner if problems. You Can Quit Smoking If you are ready to quit smoking or are thinking about it, congratulations! You have chosen to help yourself be healthier and live longer! There are lots of different ways to quit smoking. Nicotine gum, nicotine patches, a nicotine inhaler, or nicotine nasal spray can help with physical craving. Hypnosis, support groups, and medicines help break the habit of smoking. TIPS TO GET OFF AND STAY OFF CIGARETTES  Learn to predict your moods. Do not let a bad situation be your excuse to have a cigarette. Some situations in your life might tempt you to have a cigarette.   Ask friends and co-workers not to smoke around you.   Make your home smoke-free.   Never have "just one" cigarette. It leads to wanting another and another. Remind yourself of your decision to quit.   On a card, make a list of your reasons for not smoking. Read it at least the same number of times a day as you have a cigarette. Tell yourself everyday, "I do not want to smoke. I choose not to smoke."   Ask someone at home or work to help you with your plan to quit smoking.   Have something planned after you eat or have a cup of coffee. Take a walk or get other exercise to perk you up. This will help  to keep you from overeating.   Try a relaxation exercise to calm you down and decrease your stress. Remember, you may be tense and nervous the first two weeks after you quit. This will pass.   Find new activities to keep your hands busy. Play with a pen, coin, or rubber band. Doodle or draw things on paper.   Brush your teeth right after eating. This will help cut down the craving for the taste of tobacco after meals. You can try mouthwash too.   Try gum, breath mints, or diet candy to keep something in your mouth.  IF YOU SMOKE AND WANT TO QUIT:  Do not stock up on cigarettes. Never buy a carton. Wait until one pack is finished before you buy another.   Never carry cigarettes with you at work or at home.   Keep cigarettes as far away from you as possible. Leave them with someone else.   Never carry matches or a lighter with you.   Ask yourself, "Do I need this cigarette or is this just a reflex?"   Bet with someone that you can quit. Put cigarette money in a piggy bank every morning. If you smoke, you give up the money. If you do not smoke, by the end of the week, you keep the money.   Keep trying. It takes 21 days to change a habit!   Talk to your doctor about using medicines to help you quit.  These include nicotine replacement gum, lozenges, or skin patches.  Document Released: 03/29/2009 Document Revised: 05/22/2011 Document Reviewed: 03/29/2009 [facility] Patient Information 2012 [place], [place].

## 2011-10-29 NOTE — Progress Notes (Signed)
  Subjective:    Patient ID: Sheri Rasmussen, female    DOB: [birth year], 31 y.o.   MRN: [id number]  HPI  Here for follow up -reviewed chronic medical issues today:  DM2 - new dx during 10/2009 hospitalization -  checks sugars 1-3x/day - range 160-180s Working with endo for same since 2/13- on SSI in addition to janumet no hypoglycemia symptoms or events reports 100% compliance with meds  has been to education classes for same - working on diet and weight loss- cardio exercise - little success thus fa  amenorrhea - working with gynecology on same - s/p induction and BCP  morbid obesity - reports diet and exercise efforts ongoing - attended bariatric meeting "but not for me"   Past Medical History  Diagnosis Date  . Morbid obesity   . SMOKER   . PNEUMONIA 10/2009    a/w ARDS/sepsis  . AMENORRHEA   . Allergy   . DIABETES MELLITUS, TYPE II     Review of Systems Constitutional: Negative for fever or unexpected weight change.  Respiratory: Negative for cough and shortness of breath.   Cardiovascular: Negative for chest pain.     Objective:   Physical Exam  BP 120/72  Pulse 108  Temp(Src) 98.5 F (36.9 C) (Oral)  Ht 5\' 3"  (1.6 m)  Wt 251 lb (113.853 kg)  BMI 44.46 kg/m2  SpO2 96% Wt Readings from Last 3 Encounters:  10/29/11 251 lb (113.853 kg)  09/23/11 254 lb (115.214 kg)  07/25/11 253 lb (114.76 kg)   Constitutional: She is obese but appears well-developed and well-nourished. No distress. Neck: Normal range of motion. Neck supple. No JVD present. No thyromegaly present.  Cardiovascular: Normal rate, regular rhythm and normal heart sounds.  No murmur heard. No BLE edema. Pulmonary/Chest: Effort normal and breath sounds normal. No respiratory distress. She has no wheezes. Psychiatric: She has a normal mood and affect. [place] behavior is normal. Judgment and thought content normal.   Lab Results  Component Value Date   WBC 14.8* 11/08/2010   HGB 15.4* 11/08/2010   HCT 43.8  11/08/2010   PLT 253.0 11/08/2010   CHOL 182 11/08/2010   TRIG 220.0* 11/08/2010   HDL 47.20 11/08/2010   LDLDIRECT 124.0 11/08/2010   ALT 31 11/08/2010   AST 26 11/08/2010   NA 136 11/08/2010   K 4.3 11/08/2010   CL 99 11/08/2010   CREATININE 0.7 11/08/2010   BUN 18 11/08/2010   CO2 28 11/08/2010   TSH 1.79 11/08/2010   INR 1.13 10/25/2009   HGBA1C 8.7* 07/02/2011   MICROALBUR 0.4 03/15/2010        Assessment & Plan:   see problem list. Medications and labs reviewed today.

## 2011-10-29 NOTE — Assessment & Plan Note (Signed)
dx 10/2009 during hosp for ARDS uncontrolled by hx - weight remains issue Janumet and lispro tid - but variable compliance  Working with Endo ([facility]) on same since 07/2011 reminded of need for med compliance and weight loss not on ACEI due to desire for eventual pregnancy Recheck a1c now, titration/med mgmt per endo as needed Lab Results  Component Value Date   HGBA1C 8.7* 07/02/2011

## 2011-10-29 NOTE — Assessment & Plan Note (Signed)
Reviewed need for weight reduction to control DM as well as other health issues encouragement provided to continue same efforts and commended on prior success The patient is asked to make an attempt to improve diet and exercise patterns to aid in medical management of this problem.  Wt Readings from Last 3 Encounters:  10/29/11 251 lb (113.853 kg)  09/23/11 254 lb (115.214 kg)  07/25/11 253 lb (114.76 kg)

## 2011-11-04 ENCOUNTER — Ambulatory Visit (INDEPENDENT_AMBULATORY_CARE_PROVIDER_SITE_OTHER): Payer: BC Managed Care – PPO | Admitting: Endocrinology

## 2011-11-04 DIAGNOSIS — IMO0001 Reserved for inherently not codable concepts without codable children: Secondary | ICD-10-CM

## 2011-11-04 NOTE — Patient Instructions (Addendum)
Stop the janmuet Increase the humalog to 3x a day (just before each meal) 20-15-20 units. check your blood sugar 2 times a day.  vary the time of day when you check, between before the 3 meals, and at bedtime.  also check if you have symptoms of your blood sugar being too high or too low.  please keep a record of the readings and bring it to your next appointment here.  please call us sooner if your blood sugar goes below 70, or if it stays over 200.   Please come back for a follow-up appointment in 3 months.

## 2011-11-04 NOTE — Progress Notes (Signed)
  Subjective:    Patient ID: Sheri Rasmussen, female    DOB: 1981-03-03, 31 y.o.   MRN: [id number]  HPI Pt returns for f/u of insulin-requiring DM (dx'ed 2011).  She is off the glimepiride, but continues on the janumet.   She has increased the humalog to 15 unit tid (qac).  She says cbg's are mostly in the 100's.   Past Medical History  Diagnosis Date  . Morbid obesity   . SMOKER   . PNEUMONIA 10/2009    a/w ARDS/sepsis  . AMENORRHEA   . Allergy   . DIABETES MELLITUS, TYPE II     No past surgical history on file.  History   Social History  . Marital Status: Single    Spouse Name: N/A    Number of Children: N/A  . Years of Education: N/A   Occupational History  . Not on file.   Social History Main Topics  . Smoking status: Current Everyday Smoker -- 10 years    Types: Cigarettes  . Smokeless tobacco: Not on file   Comment: H[place] heritage ([place]). 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 Alcohol Use: Yes  . Drug Use: No  . Sexually Active:    Other Topics Concern  . Not on file   Social History Narrative  . No narrative on file    Current Outpatient Prescriptions on File Prior to Visit  Medication Sig Dispense Refill  . diclofenac (VOLTAREN) 75 MG EC tablet Take 1 tablet (75 mg total) by mouth 2 (two) times daily.  30 tablet  0  . Insulin Lispro, Human, (HUMALOG KWIKPEN [place]) 3x a day (just before each meal) 20-15-20 units        No Known Allergies  Family History  Problem Relation Age of Onset  . Diabetes Mother     gestational  . Cervical cancer Paternal Grandmother     There were no vitals taken for this visit.  Review of Systems denies hypoglycemia    Objective:   Physical Exam VITAL SIGNS:  See vs page GENERAL: no distress PSYCH: Alert and oriented x 3.  Does not appear anxious nor depressed.    Lab Results  Component Value Date   HGBA1C 7.7* 10/29/2011      Assessment & Plan:    DM.  needs increased rx

## 2011-11-13 ENCOUNTER — Other Ambulatory Visit: Payer: Self-pay | Admitting: *Deleted

## 2011-11-13 MED ORDER — INSULIN ASPART 100 UNIT/ML ~~LOC~~ SOLN: SUBCUTANEOUS | Status: DC

## 2011-11-13 MED ORDER — INSULIN PEN NEEDLE 32G X 4 MM MISC: Status: DC

## 2011-11-13 NOTE — Telephone Encounter (Signed)
Pt needs refill of Novolog flexpen sent to [facility] order [facility] and rx for pen needles sent to [facility]. Rx sent for Novolog and pen needles, pt informed.

## 2011-12-19 ENCOUNTER — Other Ambulatory Visit: Payer: Self-pay | Admitting: *Deleted

## 2011-12-19 MED ORDER — INSULIN PEN NEEDLE 32G X 4 MM MISC: Status: DC

## 2011-12-19 NOTE — Telephone Encounter (Signed)
Pt needs refills of pen needles sent to mail order pharmacy. She is out of pen needles. Informed pt rx sent to pharmacy and to pickup sample of pen needles until she receives rx.

## 2012-01-07 ENCOUNTER — Telehealth: Payer: Self-pay

## 2012-01-07 NOTE — Telephone Encounter (Signed)
Left message for pt to callback office.  

## 2012-01-07 NOTE — Telephone Encounter (Signed)
Pt called stating that she has noticed that after working out [place] blood sugar levels increase rather than decrease. Pt is requesting MD advisement.

## 2012-01-07 NOTE — Telephone Encounter (Signed)
If cbg's are good in general, then no need to worry.  Are they running high or good?

## 2012-02-26 ENCOUNTER — Telehealth: Payer: Self-pay | Admitting: *Deleted

## 2012-02-26 NOTE — Telephone Encounter (Signed)
Pt would like referral for a nutritionist.

## 2012-02-26 NOTE — Telephone Encounter (Signed)
done

## 2012-02-27 NOTE — Telephone Encounter (Signed)
Pt informed of referral via VM.

## 2012-03-02 ENCOUNTER — Other Ambulatory Visit (INDEPENDENT_AMBULATORY_CARE_PROVIDER_SITE_OTHER): Payer: BC Managed Care – PPO

## 2012-03-02 ENCOUNTER — Encounter: Payer: Self-pay | Admitting: Endocrinology

## 2012-03-02 ENCOUNTER — Ambulatory Visit (INDEPENDENT_AMBULATORY_CARE_PROVIDER_SITE_OTHER): Payer: BC Managed Care – PPO | Admitting: Endocrinology

## 2012-03-02 VITALS — BP 110/84 | HR 106 | Temp 97.7°F | Wt 250.0 lb

## 2012-03-02 LAB — HEMOGLOBIN A1C: Hgb A1c MFr Bld: 9.6 % — ABNORMAL HIGH (ref 4.6–6.5)

## 2012-03-02 NOTE — Patient Instructions (Addendum)
Please continue the same insulin, pending the blood test result.   check your blood sugar 2 times a day.  vary the time of day when you check, between before the 3 meals, and at bedtime.  also check if you have symptoms of your blood sugar being too high or too low.  please keep a record of the readings and bring it to your next appointment here.  please call us sooner if your blood sugar goes below 70, or if it stays over 200.   Please come back for a follow-up appointment in 4 months.   blood tests are being requested for you today.  You will receive a letter with results.

## 2012-03-02 NOTE — Progress Notes (Signed)
  Subjective:    Patient ID: Sheri Rasmussen, female    DOB: 1981-06-12, 31 y.o.   MRN: [id number]  HPI Pt returns for f/u of insulin-requiring DM (dx'ed 2011; no known complcation).   She says cbg's are well-controlled.  It is highest before lunch, and lowest in the afternoon.  It is higher at hs, than in am. Past Medical History  Diagnosis Date  . Morbid obesity   . SMOKER   . PNEUMONIA 10/2009    a/w ARDS/sepsis  . AMENORRHEA   . Allergy   . DIABETES MELLITUS, TYPE II     No past surgical history on file.  History   Social History  . Marital Status: Single    Spouse Name: N/A    Number of Children: N/A  . Years of Education: N/A   Occupational History  . Not on file.   Social History Main Topics  . Smoking status: Current Every Day Smoker -- 10 years    Types: Cigarettes  . Smokeless tobacco: Not on file   Comment: Hmong heritage ([place]). 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 Alcohol Use: Yes  . Drug Use: No  . Sexually Active:    Other Topics Concern  . Not on file   Social History Narrative  . No narrative on file    Current Outpatient Prescriptions on File Prior to Visit  Medication Sig Dispense Refill  . insulin aspart (NOVOLOG) 100 UNIT/ML injection Inject into the skin 3 times daily before meals 30-15-30 units      . Insulin Pen Needle 32G X 4 MM MISC Use as directed three times daily  300 each  3    No Known Allergies  Family History  Problem Relation Age of Onset  . Diabetes Mother     gestational  . Cervical cancer Paternal Grandmother     BP 110/84  Pulse 106  Temp 97.7 F (36.5 C) (Oral)  Wt 250 lb (113.399 kg)  SpO2 96%  LMP 07/18/2011  Review of Systems denies hypoglycemia    Objective:   Physical Exam VITAL SIGNS:  See vs page GENERAL: no distress Pulses: dorsalis pedis intact bilat.   Feet: no deformity.  no ulcer on the feet.  feet are of normal color and temp.   no edema Neuro: sensation is intact to touch on the feet   Lab Results  Component Value Date   HGBA1C 9.6* 03/02/2012      Assessment & Plan:  DM, needs increased rx

## 2012-03-05 ENCOUNTER — Telehealth: Payer: Self-pay

## 2012-03-05 NOTE — Telephone Encounter (Signed)
Is it high at other times of day, or just in am?

## 2012-03-05 NOTE — Telephone Encounter (Signed)
Pt called stating that Broder fasting blood sugar this morning was 200+ and 250 15 minutes prior to call. Pt is requesting advisement from MD

## 2012-03-08 NOTE — Telephone Encounter (Signed)
Please increase the insulin to 3 times daily before meals 30-15-40 units. Please call if it stays high

## 2012-03-08 NOTE — Telephone Encounter (Signed)
Pt states that [place] blood sugars are elevated mostly in the morning but she is currently sick with sinus and ear infection. She has been on ABX for the last 3 days.

## 2012-03-08 NOTE — Telephone Encounter (Signed)
Pt advised and understood. 

## 2012-03-25 ENCOUNTER — Ambulatory Visit: Payer: BC Managed Care – PPO | Admitting: *Deleted

## 2012-03-30 ENCOUNTER — Encounter: Payer: Self-pay | Admitting: *Deleted

## 2012-03-30 ENCOUNTER — Encounter: Payer: BC Managed Care – PPO | Attending: Endocrinology | Admitting: *Deleted

## 2012-03-30 VITALS — Ht 63.0 in | Wt 253.5 lb

## 2012-03-30 DIAGNOSIS — E119 Type 2 diabetes mellitus without complications: Secondary | ICD-10-CM | POA: Insufficient documentation

## 2012-03-30 DIAGNOSIS — Z713 Dietary counseling and surveillance: Secondary | ICD-10-CM | POA: Insufficient documentation

## 2012-03-30 NOTE — Patient Instructions (Addendum)
Plan: Aim for 3 Carb Choices (45 grams) per meal +/- 1 either way Aim for 0-1 Carb Choice per snack if hungry Consider reading food labels for total carbohydrate of foods Consider that there are other diabetes medication options for you to discuss with your MD Continue checking BG as directed by MD Continue with activity at gym as able

## 2012-03-30 NOTE — Progress Notes (Signed)
  Medical Nutrition Therapy:  Appt start time: 1500 end time:  1600.  Assessment:  Primary concerns today: patient here for diabetes education. She was diagnosed 2 years ago and received initial diabetes education at that time. 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 She lives with Oehler husband and they both do the food shopping and she does the food preparation. She attended the Bariatric Information Session for weight loss but has decided that she is not comfortable with that and wants to work on [place] weight loss with diet and exercise.  MEDICATIONS: see list. Current diabetes medication is Novolog Pen at meal time with sliding scale based on [place] BG.    DIETARY INTAKE:  Usual eating pattern includes 3 meals and 2-3 snacks per day.  Everyday foods include good variety of all food groups, eats out often.  Avoided foods include foods high in sugar.    24-hr recall:  B ( AM): cafe at work; croissant with egg beaters, sausage and cheese, iced coffee, 1/2 and 1/2, 4 Splenda and 1 Expresso  Snk ( AM): yogurt fruit parfait  L ( PM): cafe at work; hot meal OR Asian chicken salad OR salad bar OR wrap or sandwich, water Snk ( PM): occasional chex mix OR pretzels D ( PM): chicken or pork with starch, occasionally vegetables, water Snk ( PM): none unless fresh fruit Beverages: iced coffee, water, diet soda  Usual physical activity: went to gym until 1 month ago, in last semester of school so time is very limited. Likes Zumba, aerobics, free weights, and treadmill.  Estimated energy needs: 1400 calories 158 g carbohydrates 105 g protein 39 g fat  Progress Towards Goal(s):  In progress.   Nutritional Diagnosis:  NI-1.5 Excessive energy intake As related to activity level.  As evidenced by BMI of 45 .    Intervention:  Nutrition counseling and diabetes education initiated. Discussed basic physiology of diabetes, SMBG and rationale of checking BG at alternate times of  day, A1c, Carb Counting and reading food labels, and benefits of increased activity. Also discussed action of [place] Novolog and the potential of moving toward a more Intensive Insulin Therapy with use of a Carb Ratio so she could adjust Czajka insulin based on actual food intake.  Plan: Aim for 3 Carb Choices (45 grams) per meal +/- 1 either way Aim for 0-1 Carb Choice per snack if hungry Consider reading food labels for total carbohydrate of foods Consider that there are other diabetes medication options for you to discuss with your MD Continue checking BG as directed by MD Continue with activity at gym as able   Handouts given during visit include: Living Well with Diabetes Carb Counting and Food Label handouts Meal Plan Card  Diabetes Medication List  Monitoring/Evaluation:  Dietary intake, exercise, reading food labels, and body weight in 2 month(s).

## 2012-04-14 ENCOUNTER — Encounter: Payer: Self-pay | Admitting: Internal Medicine

## 2012-04-14 ENCOUNTER — Ambulatory Visit (INDEPENDENT_AMBULATORY_CARE_PROVIDER_SITE_OTHER): Payer: BC Managed Care – PPO | Admitting: Internal Medicine

## 2012-04-14 VITALS — BP 140/88 | HR 94 | Temp 97.7°F | Ht 63.0 in | Wt 252.2 lb

## 2012-04-14 DIAGNOSIS — F17200 Nicotine dependence, unspecified, uncomplicated: Secondary | ICD-10-CM

## 2012-04-14 MED ORDER — INSULIN DETEMIR 100 UNIT/ML ~~LOC~~ SOLN: 20.0000 [IU] | Freq: Every day | SUBCUTANEOUS | Status: DC

## 2012-04-14 NOTE — Assessment & Plan Note (Signed)
5 minutes today spent counseling patient on unhealthy effects of continued tobacco abuse and encouragement of cessation including medical options available to help the patient quit smoking. 

## 2012-04-14 NOTE — Patient Instructions (Addendum)
It was good to see you today. Add Levemir basal insulin 20 units at bedtime in addition to ongoing Novolog - Your prescription(s) have been submitted to your pharmacy. Please take as directed and contact our office if you believe you are having problem(s) with the medication(s). Call if sugars <70 or >200 for titration as needed we'll make referral to DrBalan . Our office will contact you regarding appointment(s) once made. Other medications reviewed, no other changes at this time. Smoking Cessation Quitting smoking is important to your health and has many advantages. However, it is not always easy to quit since nicotine is a very addictive drug. Often times, people try 3 times or more before being able to quit. This document explains the best ways for you to prepare to quit smoking. Quitting takes hard work and a lot of effort, but you can do it. ADVANTAGES OF QUITTING SMOKING  You will live longer, feel better, and live better.   Your body will feel the impact of quitting smoking almost immediately.   Within 20 minutes, blood pressure decreases. Your pulse returns to its normal level.   After 8 hours, carbon monoxide levels in the blood return to normal. Your oxygen level increases.   After 24 hours, the chance of having a heart attack starts to decrease. Your breath, hair, and body stop smelling like smoke.   After 48 hours, damaged nerve endings begin to recover. Your sense of taste and smell improve.   After 72 hours, the body is virtually free of nicotine. Your bronchial tubes relax and breathing becomes easier.   After 2 to 12 weeks, lungs can hold more air. Exercise becomes easier and circulation improves.   The risk of having a heart attack, stroke, cancer, or lung disease is greatly reduced.   After 1 year, the risk of coronary heart disease is cut in half.   After 5 years, the risk of stroke falls to the same as a nonsmoker.   After 10 years, the risk of lung cancer is cut  in half and the risk of other cancers decreases significantly.   After 15 years, the risk of coronary heart disease drops, usually to the level of a nonsmoker.   If you are pregnant, quitting smoking will improve your chances of having a healthy baby.   The people you live with, especially any children, will be healthier.   You will have extra money to spend on things other than cigarettes.  QUESTIONS TO THINK ABOUT BEFORE ATTEMPTING TO QUIT You may want to talk about your answers with your caregiver.  Why do you want to quit?   If you tried to quit in the past, what helped and what did not?   What will be the most difficult situations for you after you quit? How will you plan to handle them?   Who can help you through the tough times? Your family? Friends? A caregiver?   What pleasures do you get from smoking? What ways can you still get pleasure if you quit?  Here are some questions to ask your caregiver:  How can you help me to be successful at quitting?   What medicine do you think would be best for me and how should I take it?   What should I do if I need more help?   What is smoking withdrawal like? How can I get information on withdrawal?  GET READY  Set a quit date.   Change your environment by getting rid  of all cigarettes, ashtrays, matches, and lighters in your home, car, or work. Do not let people smoke in your home.   Review your past attempts to quit. Think about what worked and what did not.  GET SUPPORT AND ENCOURAGEMENT You have a better chance of being successful if you have help. You can get support in many ways.  Tell your family, friends, and co-workers that you are going to quit and need their support. Ask them not to smoke around you.   Get individual, group, or telephone counseling and support. Programs are available at [employer] and health centers. Call your local health department for information about programs in your area.   Spiritual  beliefs and practices may help some smokers quit.   Download a "quit meter" on your computer to keep track of quit statistics, such as how long you have gone without smoking, cigarettes not smoked, and money saved.   Get a self-help book about quitting smoking and staying off of tobacco.  LEARN NEW SKILLS AND BEHAVIORS  Distract yourself from urges to smoke. Talk to someone, go for a walk, or occupy your time with a task.   Change your normal routine. Take a different route to work. Drink tea instead of coffee. Eat breakfast in a different place.   Reduce your stress. Take a hot bath, exercise, or read a book.   Plan something enjoyable to do every day. Reward yourself for not smoking.   Explore interactive web-based programs that specialize in helping you quit.  GET MEDICINE AND USE IT CORRECTLY Medicines can help you stop smoking and decrease the urge to smoke. Combining medicine with the above behavioral methods and support can greatly increase your chances of successfully quitting smoking.  Nicotine replacement therapy helps deliver nicotine to your body without the negative effects and risks of smoking. Nicotine replacement therapy includes nicotine gum, lozenges, inhalers, nasal sprays, and skin patches. Some may be available over-the-counter and others require a prescription.   Antidepressant medicine helps people abstain from smoking, but how this works is unknown. This medicine is available by prescription.   Nicotinic receptor partial agonist medicine simulates the effect of nicotine in your brain. This medicine is available by prescription.  Ask your caregiver for advice about which medicines to use and how to use them based on your health history. Your caregiver will tell you what side effects to look out for if you choose to be on a medicine or therapy. Carefully read the information on the package. Do not use any other product containing nicotine while using a nicotine  replacement product.   RELAPSE OR DIFFICULT SITUATIONS Most relapses occur within the first 3 months after quitting. Do not be discouraged if you start smoking again. Remember, most people try several times before finally quitting. You may have symptoms of withdrawal because your body is used to nicotine. You may crave cigarettes, be irritable, feel very hungry, cough often, get headaches, or have difficulty concentrating. The withdrawal symptoms are only temporary. They are strongest when you first quit, but they will go away within 10 14 days. To reduce the chances of relapse, try to:  Avoid drinking alcohol. Drinking lowers your chances of successfully quitting.   Reduce the amount of caffeine you consume. Once you quit smoking, the amount of caffeine in your body increases and can give you symptoms, such as a rapid heartbeat, sweating, and anxiety.   Avoid smokers because they can make you want to smoke.  Do not let weight gain distract you. Many smokers will gain weight when they quit, usually less than 10 pounds. Eat a healthy diet and stay active. You can always lose the weight gained after you quit.   Find ways to improve your mood other than smoking.  FOR MORE INFORMATION   www.smokefree.gov   Document Released: 05/27/2001 Document Revised: 12/02/2011 Document Reviewed: 09/11/2011 [facility] Patient Information 2013 [place], [place].

## 2012-04-14 NOTE — Progress Notes (Signed)
  Subjective:    Patient ID: Sheri Rasmussen, female    DOB: 06/11/1981, 31 y.o.   MRN: [id number]  HPI  Here for follow up -reviewed chronic medical issues today:  DM2 - new dx during 10/2009 hospitalization -  checks sugars 1-3x/day - range 160-200s Working with endo for same since 07/2011-  on Novolog TID since 10/2011 (previous treatment with SSI and janumet) - ?new endo opinion no hypoglycemia symptoms or events reports 100% compliance with meds  has been to education and nutrition classes for same  working on diet and weight loss- cardio exercise - little success thus far  amenorrhea - working with gynecology and infertility on same - s/p induction and BCP -   morbid obesity - reports diet and exercise efforts ongoing - attended bariatric meeting in 2012 "but not for me"   Past Medical History  Diagnosis Date  . Morbid obesity   . SMOKER   . PNEUMONIA 10/2009    a/w ARDS/sepsis  . AMENORRHEA   . Allergy   . DIABETES MELLITUS, TYPE II     Review of Systems Constitutional: Negative for fever or unexpected weight change.  Respiratory: Negative for cough and shortness of breath.   Cardiovascular: Negative for chest pain.     Objective:   Physical Exam  BP 140/88  Pulse 94  Temp 97.7 F (36.5 C) (Oral)  Ht 5\' 3"  (1.6 m)  Wt 252 lb 3.2 oz (114.397 kg)  BMI 44.68 kg/m2  SpO2 97% Wt Readings from Last 3 Encounters:  04/14/12 252 lb 3.2 oz (114.397 kg)  03/30/12 253 lb 8 oz (114.987 kg)  03/02/12 250 lb (113.399 kg)   Constitutional: She is obese, but appears well-developed and well-nourished. No distress. Neck: Normal range of motion. Neck supple. No JVD present. No thyromegaly present.  Cardiovascular: Normal rate, regular rhythm and normal heart sounds.  No murmur heard. No BLE edema. Pulmonary/Chest: Effort normal and breath sounds normal. No respiratory distress. She has no wheezes. Psychiatric: She has a normal mood and affect. [place] behavior is normal. Judgment and  thought content normal.   Lab Results  Component Value Date   WBC 14.8* 11/08/2010   HGB 15.4* 11/08/2010   HCT 43.8 11/08/2010   PLT 253.0 11/08/2010   CHOL 182 11/08/2010   TRIG 220.0* 11/08/2010   HDL 47.20 11/08/2010   LDLDIRECT 124.0 11/08/2010   ALT 31 11/08/2010   AST 26 11/08/2010   NA 136 11/08/2010   K 4.3 11/08/2010   CL 99 11/08/2010   CREATININE 0.7 11/08/2010   BUN 18 11/08/2010   CO2 28 11/08/2010   TSH 1.79 11/08/2010   INR 1.13 10/25/2009   HGBA1C 9.6* 03/02/2012   MICROALBUR 2.6* 10/29/2011        Assessment & Plan:   see problem list. Medications and labs reviewed today.

## 2012-04-14 NOTE — Assessment & Plan Note (Signed)
Reviewed need for weight reduction to control DM as well as other health issues encouragement provided to continue same efforts and commended on prior success The patient is asked to make an attempt to improve diet and exercise patterns to aid in medical management of this problem.  Wt Readings from Last 3 Encounters:  04/14/12 252 lb 3.2 oz (114.397 kg)  03/30/12 253 lb 8 oz (114.987 kg)  03/02/12 250 lb (113.399 kg)

## 2012-04-14 NOTE — Assessment & Plan Note (Signed)
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 variable compliance  Add basal insulin and continue titration  Has been working with Endo ([facility]) since 07/2011, refer to 2nd opinion endo at pt request - balan reminded of need for med compliance and weight loss not on ACEI due to desire for eventual pregnancy  Lab Results  Component Value Date   HGBA1C 9.6* 03/02/2012

## 2012-05-24 ENCOUNTER — Ambulatory Visit: Payer: BC Managed Care – PPO | Admitting: *Deleted

## 2012-06-22 ENCOUNTER — Encounter: Payer: BC Managed Care – PPO | Attending: Endocrinology | Admitting: *Deleted

## 2012-06-22 ENCOUNTER — Encounter: Payer: Self-pay | Admitting: *Deleted

## 2012-06-22 DIAGNOSIS — Z713 Dietary counseling and surveillance: Secondary | ICD-10-CM | POA: Insufficient documentation

## 2012-06-22 DIAGNOSIS — E119 Type 2 diabetes mellitus without complications: Secondary | ICD-10-CM | POA: Insufficient documentation

## 2012-06-22 DIAGNOSIS — E1149 Type 2 diabetes mellitus with other diabetic neurological complication: Secondary | ICD-10-CM

## 2012-06-22 NOTE — Progress Notes (Signed)
  Medical Nutrition Therapy:  Appt start time: 1500 end time:  1530.  Assessment:  Primary concerns today: patient here for diabetes education follow up visit. She has finished with school and is going back to the gym as she has time to focus on herself for now. She is happy with [place] weight loss of 5 pounds but states on [place] scales at home she has lost closer to 12 pounds. Addition of Levemir insulin noted.  She commented again that she is not interested in surgery as method of weight loss.  MEDICATIONS: see list. Current diabetes medication is now Levemir @ 20 units at bedtime along with Novolog Pen at meal time with sliding scale based on [place] BG.    DIETARY INTAKE:  Usual eating pattern includes 3 meals and 2-3 snacks per day.  Everyday foods include good variety of all food groups, eats out often.  Avoided foods include foods high in sugar.    24-hr recall:  B ( AM): cafe at work; croissant with egg beaters, sausage and cheese, iced coffee, 1/2 and 1/2, 4 Splenda and 1 Expresso  Snk ( AM): yogurt fruit parfait  L ( PM): cafe at work; hot meal OR Asian chicken salad OR salad bar OR wrap or sandwich, water Snk ( PM): occasional chex mix OR pretzels D ( PM): chicken or pork with starch, occasionally vegetables, water Snk ( PM): none unless fresh fruit Beverages: iced coffee, water, diet soda  Usual physical activity: now going to gym and still likes Zumba, aerobics, free weights, and treadmill.  Estimated energy needs: 1400 calories 158 g carbohydrates 105 g protein 39 g fat  Progress Towards Goal(s):  In progress.   Nutritional Diagnosis:  NI-1.5 Excessive energy intake As related to activity level.  As evidenced by BMI of 45 which is now reduced to 44.1 .    Intervention:  Discussed future need to notify MD as BG continues to come down as weight loss continues so insulin doses can be adjusted to prevent hypoglycemia. Also discussed availability to do CGM evaluation in this office  if MD is interested with iPro2. Patient also had questions about considering an insulin pump and would like to discuss this with Dr. Talmage Nap also. She is doing well with Carb Counting and states she is motivated to continue with gym several days a week.  Plan: Continue to aim for 3 Carb Choices (45 grams) per meal +/- 1 either way Continue to aim for 0-1 Carb Choice per snack if hungry  Continue reading food labels for total carbohydrate of foods as needed Continue checking BG as directed by MD Continue with activity at gym as able    Handouts given during visit include:  Intro to pumping overview  Monitoring/Evaluation:  Dietary intake, exercise, reading food labels, and body weight in 6 weeks.

## 2012-06-22 NOTE — Patient Instructions (Addendum)
Plan: Continue to aim for 3 Carb Choices (45 grams) per meal +/- 1 either way Continue to aim for 0-1 Carb Choice per snack if hungry  Continue reading food labels for total carbohydrate of foods as needed Continue checking BG as directed by MD Continue with activity at gym as able

## 2012-06-28 ENCOUNTER — Encounter: Payer: Self-pay | Admitting: *Deleted

## 2012-06-29 ENCOUNTER — Ambulatory Visit: Payer: BC Managed Care – PPO | Admitting: Endocrinology

## 2012-07-15 ENCOUNTER — Ambulatory Visit: Payer: BC Managed Care – PPO | Admitting: Internal Medicine

## 2012-07-28 ENCOUNTER — Ambulatory Visit (INDEPENDENT_AMBULATORY_CARE_PROVIDER_SITE_OTHER): Payer: BC Managed Care – PPO | Admitting: Internal Medicine

## 2012-07-28 ENCOUNTER — Other Ambulatory Visit (INDEPENDENT_AMBULATORY_CARE_PROVIDER_SITE_OTHER): Payer: BC Managed Care – PPO

## 2012-07-28 ENCOUNTER — Encounter: Payer: Self-pay | Admitting: Internal Medicine

## 2012-07-28 ENCOUNTER — Ambulatory Visit: Payer: BC Managed Care – PPO | Admitting: Internal Medicine

## 2012-07-28 VITALS — BP 112/70 | HR 96 | Temp 98.4°F | Ht 63.0 in | Wt 251.8 lb

## 2012-07-28 DIAGNOSIS — E1149 Type 2 diabetes mellitus with other diabetic neurological complication: Secondary | ICD-10-CM

## 2012-07-28 DIAGNOSIS — F17200 Nicotine dependence, unspecified, uncomplicated: Secondary | ICD-10-CM

## 2012-07-28 LAB — LIPID PANEL
Cholesterol: 193 mg/dL (ref 0–200)
Total CHOL/HDL Ratio: 5
Triglycerides: 306 mg/dL — ABNORMAL HIGH (ref 0.0–149.0)
VLDL: 61.2 mg/dL — ABNORMAL HIGH (ref 0.0–40.0)

## 2012-07-28 NOTE — Patient Instructions (Signed)
It was good to see you today. We have reviewed your prior records including labs and tests today Test(s) ordered today. Your results will be released to MyChart (or called to you) after review, usually within 72hours after test completion. If any changes need to be made, you will be notified at that same time. Other medications reviewed, no other changes at this time. Work on lifestyle changes as discussed (low fat, low carb, increased protein diet; improved exercise efforts; weight loss) to control sugar, blood pressure and cholesterol levels and/or reduce risk of developing other medical problems. Look into [email or web address] or other type of food journal to assist you in this process. Please schedule followup in 6 months for weight check, call sooner if problems.  Smoking Cessation Quitting smoking is important to your health and has many advantages. However, it is not always easy to quit since nicotine is a very addictive drug. Often times, people try 3 times or more before being able to quit. This document explains the best ways for you to prepare to quit smoking. Quitting takes hard work and a lot of effort, but you can do it. ADVANTAGES OF QUITTING SMOKING  You will live longer, feel better, and live better.   Your body will feel the impact of quitting smoking almost immediately.   Within 20 minutes, blood pressure decreases. Your pulse returns to its normal level.   After 8 hours, carbon monoxide levels in the blood return to normal. Your oxygen level increases.   After 24 hours, the chance of having a heart attack starts to decrease. Your breath, hair, and body stop smelling like smoke.   After 48 hours, damaged nerve endings begin to recover. Your sense of taste and smell improve.   After 72 hours, the body is virtually free of nicotine. Your bronchial tubes relax and breathing becomes easier.   After 2 to 12 weeks, lungs can hold more air. Exercise becomes easier and circulation  improves.   The risk of having a heart attack, stroke, cancer, or lung disease is greatly reduced.   After 1 year, the risk of coronary heart disease is cut in half.   After 5 years, the risk of stroke falls to the same as a nonsmoker.   After 10 years, the risk of lung cancer is cut in half and the risk of other cancers decreases significantly.   After 15 years, the risk of coronary heart disease drops, usually to the level of a nonsmoker.   If you are pregnant, quitting smoking will improve your chances of having a healthy baby.   The people you live with, especially any children, will be healthier.   You will have extra money to spend on things other than cigarettes.  QUESTIONS TO THINK ABOUT BEFORE ATTEMPTING TO QUIT You may want to talk about your answers with your caregiver.  Why do you want to quit?   If you tried to quit in the past, what helped and what did not?   What will be the most difficult situations for you after you quit? How will you plan to handle them?   Who can help you through the tough times? Your family? Friends? A caregiver?   What pleasures do you get from smoking? What ways can you still get pleasure if you quit?  Here are some questions to ask your caregiver:  How can you help me to be successful at quitting?   What medicine do you think would be best for me and  how should I take it?   What should I do if I need more help?   What is smoking withdrawal like? How can I get information on withdrawal?  GET READY  Set a quit date.   Change your environment by getting rid of all cigarettes, ashtrays, matches, and lighters in your home, car, or work. Do not let people smoke in your home.   Review your past attempts to quit. Think about what worked and what did not.  GET SUPPORT AND ENCOURAGEMENT You have a better chance of being successful if you have help. You can get support in many ways.  Tell your family, friends, and co-workers that you are  going to quit and need their support. Ask them not to smoke around you.   Get individual, group, or telephone counseling and support. Programs are available at [employer] and health centers. Call your local health department for information about programs in your area.   Spiritual beliefs and practices may help some smokers quit.   Download a "quit meter" on your computer to keep track of quit statistics, such as how long you have gone without smoking, cigarettes not smoked, and money saved.   Get a self-help book about quitting smoking and staying off of tobacco.  LEARN NEW SKILLS AND BEHAVIORS  Distract yourself from urges to smoke. Talk to someone, go for a walk, or occupy your time with a task.   Change your normal routine. Take a different route to work. Drink tea instead of coffee. Eat breakfast in a different place.   Reduce your stress. Take a hot bath, exercise, or read a book.   Plan something enjoyable to do every day. Reward yourself for not smoking.   Explore interactive web-based programs that specialize in helping you quit.  GET MEDICINE AND USE IT CORRECTLY Medicines can help you stop smoking and decrease the urge to smoke. Combining medicine with the above behavioral methods and support can greatly increase your chances of successfully quitting smoking.  Nicotine replacement therapy helps deliver nicotine to your body without the negative effects and risks of smoking. Nicotine replacement therapy includes nicotine gum, lozenges, inhalers, nasal sprays, and skin patches. Some may be available over-the-counter and others require a prescription.   Antidepressant medicine helps people abstain from smoking, but how this works is unknown. This medicine is available by prescription.   Nicotinic receptor partial agonist medicine simulates the effect of nicotine in your brain. This medicine is available by prescription.  Ask your caregiver for advice about which medicines to  use and how to use them based on your health history. Your caregiver will tell you what side effects to look out for if you choose to be on a medicine or therapy. Carefully read the information on the package. Do not use any other product containing nicotine while using a nicotine replacement product.   RELAPSE OR DIFFICULT SITUATIONS Most relapses occur within the first 3 months after quitting. Do not be discouraged if you start smoking again. Remember, most people try several times before finally quitting. You may have symptoms of withdrawal because your body is used to nicotine. You may crave cigarettes, be irritable, feel very hungry, cough often, get headaches, or have difficulty concentrating. The withdrawal symptoms are only temporary. They are strongest when you first quit, but they will go away within 10 14 days. To reduce the chances of relapse, try to:  Avoid drinking alcohol. Drinking lowers your chances of successfully quitting.  Reduce the amount of caffeine you consume. Once you quit smoking, the amount of caffeine in your body increases and can give you symptoms, such as a rapid heartbeat, sweating, and anxiety.   Avoid smokers because they can make you want to smoke.   Do not let weight gain distract you. Many smokers will gain weight when they quit, usually less than 10 pounds. Eat a healthy diet and stay active. You can always lose the weight gained after you quit.   Find ways to improve your mood other than smoking.  FOR MORE INFORMATION   www.smokefree.gov   Document Released: 05/27/2001 Document Revised: 12/02/2011 Document Reviewed: 09/11/2011 [facility] Patient Information 2013 [place], [place].

## 2012-07-28 NOTE — Assessment & Plan Note (Signed)
dx 10/2009 during hosp for ARDS improving control by hx - weight remains issue Has been working with Endo ([facility]) since 07/2011, referred to [facility] for 2nd opinion at pt request -  Reports last a1c <7 Med changes reviewed and updated reminded of need for med compliance and weight loss not on ACEI or statin due to desire for eventual pregnancy  Lab Results  Component Value Date   HGBA1C 9.6* 03/02/2012

## 2012-07-28 NOTE — Assessment & Plan Note (Signed)
Reviewed need for weight reduction to control DM as well as other health issues encouragement provided to continue same efforts and commended on prior success The patient is asked to make an attempt to improve diet and exercise patterns to aid in medical management of this problem.  Wt Readings from Last 3 Encounters:  07/28/12 251 lb 12.8 oz (114.216 kg)  06/28/12 248 lb 9.6 oz (112.764 kg)  04/14/12 252 lb 3.2 oz (114.397 kg)

## 2012-07-28 NOTE — Progress Notes (Signed)
Subjective:    Patient ID: Sheri Rasmussen, female    DOB: Aug 26, 1980, 32 y.o.   MRN: [id number] Pt here for follow-up of chronic medical issues HPI Diabetes- Pt follows regularly with Dr. Lurene Shadow (Endo) for management.  Pt reports HgbA1C from Dr. Lurene Shadow 6.7.  Pt also follows with nutritionist for diet management.  Pt reports decreased headaches and increased energy levels with sugar under better control.  Pt reports recently seeing with eye doctor to monitor for diabetes related changes.  Denies signs/sx of low sugars, vision changes, headaches and polyuria. Tobacco use- Pt reports still smoking social or one a day due to habit.  Reports increased urge when with friends who smoking and with certain habits during the day.  Pt understands she should quit. Obesity- Pt follows with nutritionist for diet management related to weight and diabetes.  Pt feels discouraged that nothing has seemed to work so far but is doing better with Jenniges sugars.  She reports drinking a lot of water but minimal exercise. Past Medical History  Diagnosis Date  . Morbid obesity   . SMOKER   . PNEUMONIA 10/2009    a/w ARDS/sepsis  . AMENORRHEA   . Allergy   . DIABETES MELLITUS, TYPE II     Review of Systems  Constitutional: Negative for fever, activity change, fatigue and unexpected weight change.  Respiratory: Negative for cough and shortness of breath.   Cardiovascular: Negative for chest pain and leg swelling.  Neurological: Negative for syncope, weakness, numbness and headaches.       Objective:   Physical Exam  Constitutional: She is oriented to person, place, and time. She appears well-developed and well-nourished. No distress.  Neck: Normal range of motion. Neck supple. No thyromegaly present.  Cardiovascular: Normal rate, regular rhythm and normal heart sounds.  Exam reveals no gallop and no friction rub.   No murmur heard. Pulmonary/Chest: Effort normal and breath sounds normal.  Abdominal: Soft. Bowel sounds are  normal. She exhibits no distension. There is no tenderness.  Lymphadenopathy:    She has no cervical adenopathy.  Neurological: She is alert and oriented to person, place, and time.  Skin: Skin is warm and dry.  Psychiatric: She has a normal mood and affect. Coll behavior is normal. Judgment and thought content normal.   BP 112/70  Pulse 96  Temp(Src) 98.4 F (36.9 C) (Oral)  Ht 5\' 3"  (1.6 m)  Wt 251 lb 12.8 oz (114.216 kg)  BMI 44.62 kg/m2  SpO2 96% Lab Results  Component Value Date   WBC 14.8* 11/08/2010   HGB 15.4* 11/08/2010   HCT 43.8 11/08/2010   PLT 253.0 11/08/2010   GLUCOSE 243* 11/08/2010   CHOL 182 11/08/2010   TRIG 220.0* 11/08/2010   HDL 47.20 11/08/2010   LDLDIRECT 124.0 11/08/2010   LDLCALC  Value: 42        Total Cholesterol/HDL:CHD Risk Coronary Heart Disease Risk Table                     Men   Women  1/2 Average Risk   3.4   3.3  Average Risk       5.0   4.4  2 X Average Risk   9.6   7.1  3 X Average Risk  23.4   11.0        Use the calculated Patient Ratio above and the CHD Risk Table to determine the patient's CHD Risk.        ATP III  CLASSIFICATION (LDL):  <100     mg/dL   Optimal  161-096  mg/dL   Near or Above                    Optimal  130-159  mg/dL   Borderline  045-409  mg/dL   High  >811     mg/dL   Very High 02/28/7828   ALT 31 11/08/2010   AST 26 11/08/2010   NA 136 11/08/2010   K 4.3 11/08/2010   CL 99 11/08/2010   CREATININE 0.7 11/08/2010   BUN 18 11/08/2010   CO2 28 11/08/2010   TSH 1.79 11/08/2010   INR 1.13 10/25/2009   HGBA1C 9.6* 03/02/2012   MICROALBUR 2.6* 10/29/2011   Wt Readings from Last 3 Encounters:  07/28/12 251 lb 12.8 oz (114.216 kg)  06/28/12 248 lb 9.6 oz (112.764 kg)  04/14/12 252 lb 3.2 oz (114.397 kg)      Assessment & Plan:  Diabetes- Continue following with Dr. Lurene Shadow.  Will check A1C today to verify and monitor control.  Continue to follow with nutrition and eye doctor.  Maintain current medications and dosing as prescribed. Obesity-  Follows with nutrition.  Pt reports will try to increase exercise and make diet changes where possible.  Pt would like to try OTC weight loss supplements. Counseled to avoid stimulants and follow mainstay of diet and exercise while on supplements. Tobacco Abuse- Pt counseled >54mins on changing habits and resources available for smoking cessation.  Pt states will try to cut back more.  Follow-up in 6mos for diabetes and wt management.  Sheri Pullin A Juancarlos Crescenzo PA-S  I have personally reviewed this case with PA student. I also personally examined this patient. I agree with history and findings as documented above. I reviewed, discussed and approve of the assessment and plan as listed above. Rene Paci, MD

## 2012-07-29 ENCOUNTER — Encounter: Payer: Self-pay | Admitting: Internal Medicine

## 2012-07-29 NOTE — Assessment & Plan Note (Signed)
5 minutes today spent counseling patient on unhealthy effects of continued tobacco abuse and encouragement of cessation including medical options available to help the patient quit smoking. 

## 2012-07-29 NOTE — Progress Notes (Signed)
  Subjective:    Patient ID: Sheri Rasmussen, female    DOB: 12-20-1980, 32 y.o.   MRN: [id number]  HPI  Here for follow up -reviewed chronic medical issues today:  DM2 - new dx during 10/2009 hospitalization -  checks sugars 1-3x/day - range 160-200s Working with endo for same since 07/2011, changed to [facility] 04/2012-   no hypoglycemia symptoms or events reports 100% compliance with meds  has been to education and nutrition classes for same  working on diet and weight loss- cardio exercise - little success thus far  amenorrhea - working with gynecology and infertility on same - s/p induction and BCP -   morbid obesity - reports diet and exercise efforts ongoing - attended bariatric meeting in 2012 "but not for me"  ?hyperlipidemia - recent labs at employer [facility] indicated poor control   Past Medical History  Diagnosis Date  . Morbid obesity   . SMOKER   . PNEUMONIA 10/2009    a/w ARDS/sepsis  . AMENORRHEA   . Allergy   . DIABETES MELLITUS, TYPE II     Review of Systems Constitutional: Negative for fever or unexpected weight change.  Respiratory: Negative for cough and shortness of breath.   Cardiovascular: Negative for chest pain.     Objective:   Physical Exam  BP 112/70  Pulse 96  Temp(Src) 98.4 F (36.9 C) (Oral)  Ht 5\' 3"  (1.6 m)  Wt 251 lb 12.8 oz (114.216 kg)  BMI 44.62 kg/m2  SpO2 96% Wt Readings from Last 3 Encounters:  07/28/12 251 lb 12.8 oz (114.216 kg)  06/28/12 248 lb 9.6 oz (112.764 kg)  04/14/12 252 lb 3.2 oz (114.397 kg)   Constitutional: She is obese, but appears well-developed and well-nourished. No distress. Neck: Normal range of motion. Neck supple. No JVD present. No thyromegaly present.  Cardiovascular: Normal rate, regular rhythm and normal heart sounds.  No murmur heard. No BLE edema. Pulmonary/Chest: Effort normal and breath sounds normal. No respiratory distress. She has no wheezes. Psychiatric: She has a normal mood and affect. [place]  behavior is normal. Judgment and thought content normal.   Lab Results  Component Value Date   WBC 14.8* 11/08/2010   HGB 15.4* 11/08/2010   HCT 43.8 11/08/2010   PLT 253.0 11/08/2010   CHOL 193 07/28/2012   TRIG 306.0* 07/28/2012   HDL 37.00* 07/28/2012   LDLDIRECT 106.7 07/28/2012   ALT 31 11/08/2010   AST 26 11/08/2010   NA 136 11/08/2010   K 4.3 11/08/2010   CL 99 11/08/2010   CREATININE 0.7 11/08/2010   BUN 18 11/08/2010   CO2 28 11/08/2010   TSH 1.79 11/08/2010   INR 1.13 10/25/2009   HGBA1C 6.8* 07/28/2012   MICROALBUR 2.6* 10/29/2011        Assessment & Plan:   see problem list. Medications and labs reviewed today.

## 2012-08-10 ENCOUNTER — Encounter: Payer: BC Managed Care – PPO | Attending: Endocrinology | Admitting: *Deleted

## 2012-08-10 ENCOUNTER — Encounter: Payer: Self-pay | Admitting: *Deleted

## 2012-08-10 DIAGNOSIS — Z713 Dietary counseling and surveillance: Secondary | ICD-10-CM | POA: Insufficient documentation

## 2012-08-10 DIAGNOSIS — E1149 Type 2 diabetes mellitus with other diabetic neurological complication: Secondary | ICD-10-CM

## 2012-08-10 DIAGNOSIS — E119 Type 2 diabetes mellitus without complications: Secondary | ICD-10-CM | POA: Insufficient documentation

## 2012-08-10 NOTE — Progress Notes (Signed)
  Medical Nutrition Therapy:  Appt start time: 1615 end time:  1645.  Assessment:  Primary concerns today: patient here for diabetes education follow up visit. Having to work 10 hour days 6-7 days a week for a major project, rest for 2 weeks and now another project so 10 hour days again. So not been able to work out due to time limitations. Very happy with improvement in A1c from 9.6 to 6.8%. She feels this is partially due to taking Metformin more consistently and the addition of Levemir which is now @ 60 units at bedtime, and Humalog is now 35/20/45 units pre meal. Having to eat evening meal much later now, often at 9 PM.  MEDICATIONS: see list. Current diabetes medication is now Levemir @ 60 units at bedtime along with Novolog Pen at meal time with sliding scale based on [place] BG.    DIETARY INTAKE:  Usual eating pattern includes 3 meals and 2-3 snacks per day.  Everyday foods include good variety of all food groups, eats out often.  Avoided foods include foods high in sugar.    24-hr recall:  B ( AM): cafe at work; croissant with egg beaters, sausage and cheese, iced coffee, 1/2 and 1/2, 4 Splenda and 1 Expresso  Snk ( AM): yogurt fruit parfait  L ( PM): cafe at work; hot meal OR Asian chicken salad OR salad bar OR wrap or sandwich, water Snk ( PM): occasional chex mix OR pretzels D ( PM): chicken or pork with starch, occasionally vegetables, water Snk ( PM): none unless fresh fruit Beverages: iced coffee, water, diet soda  Usual physical activity: no longer able to go to gym and still likes Zumba, aerobics, free weights, and treadmill.  Estimated energy needs: 1400 calories 158 g carbohydrates 105 g protein 39 g fat  Progress Towards Goal(s):  In progress.   Nutritional Diagnosis:  NI-1.5 Excessive energy intake As related to activity level.  As evidenced by BMI of 45 which is now reduced to 44.1 .    Intervention: Commended Dresser on the excellent improvement in Masri A1c. Reminded  [place] that food increases BG, activity and insulin lower it, so the only way to reduce Bevis insulin doses is with increased activity in conjunction with weight loss. She states she is having more difficulty giving Vicens injections over time, so I mentioned the possibility of asking [facility] endo about the V-Go insulin delivery device. She had no further dietary questions for today.  Plan: Continue to aim for 3 Carb Choices (45 grams) per meal +/- 1 either way Continue to aim for 0-1 Carb Choice per snack if hungry  Continue reading food labels for total carbohydrate of foods as needed Continue checking BG at alternate times of day as directed by MD Continue with activity at gym as able   Consider asking Dr. Talmage Nap about V-Go insulin delivery device  [email or web address].go-vgo.com   Monitoring/Evaluation:  Dietary intake, exercise, reading food labels, and body weight in 8 weeks.

## 2012-08-10 NOTE — Patient Instructions (Addendum)
Plan: Continue to aim for 3 Carb Choices (45 grams) per meal +/- 1 either way Continue to aim for 0-1 Carb Choice per snack if hungry  Continue reading food labels for total carbohydrate of foods as needed Continue checking BG at alternate times of day as directed by MD Continue with activity at gym as able   Consider asking Dr. Talmage Nap about V-Go insulin delivery device  [email or web address].go-[email or web address]

## 2012-10-04 ENCOUNTER — Ambulatory Visit: Payer: BC Managed Care – PPO | Admitting: *Deleted

## 2012-12-15 ENCOUNTER — Other Ambulatory Visit: Payer: Self-pay | Admitting: *Deleted

## 2012-12-15 ENCOUNTER — Other Ambulatory Visit: Payer: Self-pay | Admitting: Endocrinology

## 2012-12-15 MED ORDER — INSULIN PEN NEEDLE 32G X 4 MM MISC: Status: DC

## 2013-01-26 ENCOUNTER — Ambulatory Visit: Payer: BC Managed Care – PPO | Admitting: Internal Medicine

## 2013-01-28 ENCOUNTER — Ambulatory Visit (INDEPENDENT_AMBULATORY_CARE_PROVIDER_SITE_OTHER): Payer: BC Managed Care – PPO | Admitting: Internal Medicine

## 2013-01-28 ENCOUNTER — Encounter: Payer: Self-pay | Admitting: Internal Medicine

## 2013-01-28 VITALS — BP 110/62 | HR 101 | Temp 97.2°F | Wt 261.2 lb

## 2013-01-28 DIAGNOSIS — E1149 Type 2 diabetes mellitus with other diabetic neurological complication: Secondary | ICD-10-CM

## 2013-01-28 MED ORDER — INSULIN DETEMIR 100 UNIT/ML ~~LOC~~ SOLN: 80.0000 [IU] | Freq: Every day | SUBCUTANEOUS | Status: DC

## 2013-01-28 NOTE — Assessment & Plan Note (Signed)
dx 10/2009 during hosp for ARDS improving control by hx - weight mgmt remains issue Has been working with Endo since 07/2011, changed from [facility] to [place] at pt request -  Reports last a1c >7 - will continue Lantus titration now Med changes reviewed and updated reminded of need for med compliance and efforts at weight loss not on ACEI or statin due to desire for eventual pregnancy  Lab Results  Component Value Date   HGBA1C 7.5* 12/27/2012

## 2013-01-28 NOTE — Assessment & Plan Note (Signed)
Reviewed need for weight reduction to control DM as well as other health issues encouragement provided to continue same efforts and commended on prior success The patient is asked to make an attempt to improve diet and exercise patterns to aid in medical management of this problem.  Wt Readings from Last 3 Encounters:  01/28/13 261 lb 3.2 oz (118.48 kg)  07/28/12 251 lb 12.8 oz (114.216 kg)  06/28/12 248 lb 9.6 oz (112.764 kg)

## 2013-01-28 NOTE — Patient Instructions (Signed)
It was good to see you today. We have reviewed your prior records including labs and tests today Other medications reviewed and updated: increase Levemir to 80Units daily, no other changes at this time. Work on lifestyle changes as discussed (low fat, low carb, increased protein diet; improved exercise efforts; weight loss) to control sugar, blood pressure and cholesterol levels and/or reduce risk of developing other medical problems. Look into [email or web address] or other type of food journal to assist you in this process. Please schedule followup in 6 months for weight check, call sooner if problems.

## 2013-01-28 NOTE — Progress Notes (Signed)
  Subjective:    Patient ID: Sheri Rasmussen, female    DOB: 03-30-1981, 32 y.o.   MRN: [id number]  HPI  Here for follow up -reviewed chronic medical issues today:   Past Medical History  Diagnosis Date  . Morbid obesity   . SMOKER   . PNEUMONIA 10/2009    a/w ARDS/sepsis  . AMENORRHEA   . Allergy   . DIABETES MELLITUS, TYPE II     Review of Systems Constitutional: Negative for fever or unexpected weight change.  Respiratory: Negative for cough and shortness of breath.   Cardiovascular: Negative for chest pain.     Objective:   Physical Exam  BP 110/62  Pulse 101  Temp(Src) 97.2 F (36.2 C) (Oral)  Wt 261 lb 3.2 oz (118.48 kg)  BMI 46.28 kg/m2  SpO2 97% Wt Readings from Last 3 Encounters:  01/28/13 261 lb 3.2 oz (118.48 kg)  07/28/12 251 lb 12.8 oz (114.216 kg)  06/28/12 248 lb 9.6 oz (112.764 kg)   Constitutional: She is obese, but appears well-developed and well-nourished. No distress. Neck: Normal range of motion. Neck supple. No JVD present. No thyromegaly present.  Cardiovascular: Normal rate, regular rhythm and normal heart sounds.  No murmur heard. No BLE edema. Pulmonary/Chest: Effort normal and breath sounds normal. No respiratory distress. She has no wheezes. Psychiatric: She has a normal mood and affect. Alatorre behavior is normal. Judgment and thought content normal.   Lab Results  Component Value Date   WBC 14.8* 11/08/2010   HGB 15.4* 11/08/2010   HCT 43.8 11/08/2010   PLT 253.0 11/08/2010   CHOL 193 07/28/2012   TRIG 306.0* 07/28/2012   HDL 37.00* 07/28/2012   LDLDIRECT 106.7 07/28/2012   ALT 31 11/08/2010   AST 26 11/08/2010   NA 136 11/08/2010   K 4.3 11/08/2010   CL 99 11/08/2010   CREATININE 0.7 11/08/2010   BUN 18 11/08/2010   CO2 28 11/08/2010   TSH 1.79 11/08/2010   INR 1.13 10/25/2009   HGBA1C 7.5* 12/27/2012   MICROALBUR 2.6* 10/29/2011        Assessment & Plan:   see problem list. Medications and labs reviewed today.

## 2013-04-21 ENCOUNTER — Other Ambulatory Visit: Payer: Self-pay

## 2013-07-28 ENCOUNTER — Encounter: Payer: Self-pay | Admitting: Physician Assistant

## 2013-07-28 ENCOUNTER — Ambulatory Visit (INDEPENDENT_AMBULATORY_CARE_PROVIDER_SITE_OTHER): Payer: BC Managed Care – PPO | Admitting: Physician Assistant

## 2013-07-28 VITALS — BP 136/76 | HR 101 | Temp 97.8°F | Resp 18 | Wt 258.0 lb

## 2013-07-28 DIAGNOSIS — H6690 Otitis media, unspecified, unspecified ear: Secondary | ICD-10-CM

## 2013-07-28 DIAGNOSIS — R7301 Impaired fasting glucose: Secondary | ICD-10-CM

## 2013-07-28 DIAGNOSIS — N760 Acute vaginitis: Secondary | ICD-10-CM

## 2013-07-28 DIAGNOSIS — A499 Bacterial infection, unspecified: Secondary | ICD-10-CM

## 2013-07-28 DIAGNOSIS — H6691 Otitis media, unspecified, right ear: Secondary | ICD-10-CM

## 2013-07-28 DIAGNOSIS — B9689 Other specified bacterial agents as the cause of diseases classified elsewhere: Secondary | ICD-10-CM

## 2013-07-28 MED ORDER — FLUCONAZOLE 150 MG PO TABS: 150.0000 mg | ORAL_TABLET | Freq: Once | ORAL | Status: DC

## 2013-07-28 MED ORDER — AMOXICILLIN 500 MG PO CAPS: 500.0000 mg | ORAL_CAPSULE | Freq: Three times a day (TID) | ORAL | Status: DC

## 2013-07-28 NOTE — Patient Instructions (Signed)
It was great to meet you today Sheri Rasmussen  I have sent prescriptions to your pharmacy, please take as directed.  Bacterial Vaginosis Bacterial vaginosis is an infection of the vagina. It happens when too many of certain germs (bacteria) grow in the vagina. HOME CARE  Take your medicine as told by your doctor.  Finish your medicine even if you start to feel better.  Do not have sex until you finish your medicine and are better.  Tell your sex partner that you have an infection. They should see their doctor for treatment.  Practice safe sex. Use condoms. Have only one sex partner. GET HELP IF:  You are not getting better after 3 days of treatment.  You have more grey fluid (discharge) coming from your vagina than before.  You have more pain than before.  You have a fever. MAKE SURE YOU:   Understand these instructions.  Will watch your condition.  Will get help right away if you are not doing well or get worse. Document Released: 03/11/2008 Document Revised: 03/23/2013 Document Reviewed: 01/12/2013 [facility] Patient Information 2014 [place].    Otitis Media, Adult Otitis media is redness, soreness, and puffiness (swelling) in the space just behind your eardrum (middle ear). It may be caused by allergies or infection. It often happens along with a cold. HOME CARE  Take your medicine as told. Finish it even if you start to feel better.  Only take over-the-counter or prescription medicines for pain, discomfort, or fever as told by your doctor.  Follow up with your doctor as told. GET HELP IF:  You have otitis media only in one ear or bleeding from your nose or both.  You notice a lump on your neck.  You are not getting better in 3 5 days.  You feel worse instead of better. GET HELP RIGHT AWAY IF:   You have pain that is not helped with medicine.  You have puffiness, redness, or pain around your ear.  You get a stiff neck.  You cannot move part of your  face (paralysis).  You notice that the bone behind your ear hurts when you touch it. MAKE SURE YOU:   Understand these instructions.  Will watch your condition.  Will get help right away if you are not doing well or get worse. Document Released: 11/19/2007 Document Revised: 02/02/2013 Document Reviewed: 12/28/2012 [facility] Patient Information 2014 [place], [place].

## 2013-07-28 NOTE — Progress Notes (Signed)
   Subjective:    Patient ID: Sheri Rasmussen, female    DOB: 1980/06/27, 33 y.o.   MRN: [id number]  HPI Comments: Patient is a 33 year old female who presents to the office with multiple complaints.   1. Sinus/URI symptoms has had runny nose, nasal congestion, mild intermittent productive cough with ear pain and feeling of fullness, L>R. Symptoms have come and gone over the last two months. Has tried Mucinex, dayquil/nyquil with mild relief of symptoms. Today started using Flonase without relief.   2. Yeast infection: has had similar problems in the past, this episode started over a week ago, has vaginal itching. Worse as night and right out shower. Has tried a dose of OTC monistat with minimal relief. Denies vaginal discharge or odor.   3. Elevated blood sugar. Reports three day PTA, morning fasting blood was 297, two hours later was down to 122 which is closer to [place] normal levels. DM usually well controlled with Levemir 80 units at night and Humalog during day along with metformin.   Review of Systems  Constitutional: Negative for fever and chills.  HENT: Positive for congestion, ear pain, postnasal drip and rhinorrhea. Negative for sinus pressure.   Eyes: Negative for pain and visual disturbance.  Gastrointestinal: Negative for nausea and vomiting.  Genitourinary: Negative for dysuria, urgency, frequency, hematuria, vaginal discharge, vaginal pain and pelvic pain.       Vaginal itching  Musculoskeletal: Negative for back pain.  Neurological: Negative for dizziness, weakness, numbness and headaches.   Past Medical History  Diagnosis Date  . Morbid obesity   . SMOKER   . PNEUMONIA 10/2009    a/w ARDS/sepsis  . AMENORRHEA   . Allergy   . DIABETES MELLITUS, TYPE II       Objective:   Physical Exam  Vitals reviewed. Constitutional: She is oriented to person, place, and time. She appears well-developed and well-nourished.  HENT:  Head: Normocephalic and atraumatic.  Right Ear: Hearing,  external ear and ear canal normal. No tenderness. Tympanic membrane is erythematous and bulging.  Left Ear: Hearing, tympanic membrane, external ear and ear canal normal. No tenderness.  Nose: Rhinorrhea present. Right sinus exhibits no maxillary sinus tenderness and no frontal sinus tenderness. Left sinus exhibits no maxillary sinus tenderness and no frontal sinus tenderness.  Mouth/Throat: Uvula is midline, oropharynx is clear and moist and mucous membranes are normal.  Eyes: Conjunctivae are normal.  Neck: Normal range of motion.  Cardiovascular: Normal rate, regular rhythm and intact distal pulses.  Exam reveals no gallop and no friction rub.   No murmur heard. Pulmonary/Chest: Effort normal and breath sounds normal.  Abdominal: Soft. There is no tenderness.  Neurological: She is alert and oriented to person, place, and time.       Assessment & Plan:    Acute otitis media Rx for Amoxicillin 500 mg tid x 10 days  Bacterial vaginosis Rx for fluconazole 150 mg, take as directed  Elevated blood sugar Continue to monitor Could be elevated by ear infection Keep appointment with Dr. [name] as schedule or RTO sooner if no change.

## 2013-07-28 NOTE — Progress Notes (Signed)
Pre-visit discussion using our clinic review tool. No additional management support is needed unless otherwise documented below in the visit note.  

## 2013-08-03 ENCOUNTER — Ambulatory Visit: Payer: BC Managed Care – PPO | Admitting: Internal Medicine

## 2013-09-05 ENCOUNTER — Ambulatory Visit: Payer: BC Managed Care – PPO | Admitting: Internal Medicine

## 2013-09-06 ENCOUNTER — Encounter: Payer: Self-pay | Admitting: Internal Medicine

## 2013-09-19 ENCOUNTER — Encounter: Payer: Self-pay | Admitting: Internal Medicine

## 2013-09-19 ENCOUNTER — Ambulatory Visit (INDEPENDENT_AMBULATORY_CARE_PROVIDER_SITE_OTHER): Payer: BC Managed Care – PPO | Admitting: Internal Medicine

## 2013-09-19 DIAGNOSIS — E1149 Type 2 diabetes mellitus with other diabetic neurological complication: Secondary | ICD-10-CM

## 2013-09-19 DIAGNOSIS — F17200 Nicotine dependence, unspecified, uncomplicated: Secondary | ICD-10-CM

## 2013-09-19 NOTE — Assessment & Plan Note (Signed)
Reviewed need for weight reduction to control DM as well as other health issues Working with medical bariatric clinic on same - on phenteramine encouragement provided to continue same efforts and commended on prior success The patient is asked to make continued efforts at ongoing diet and exercise patterns to aid in medical management of this problem.  Wt Readings from Last 3 Encounters:  09/19/13 253 lb 9.6 oz (115.032 kg)  07/28/13 258 lb (117.028 kg)  01/28/13 261 lb 3.2 oz (118.48 kg)

## 2013-09-19 NOTE — Assessment & Plan Note (Signed)
5 minutes today spent counseling patient on unhealthy effects of continued tobacco abuse and encouragement of cessation including medical options available to help the patient quit smoking. 

## 2013-09-19 NOTE — Progress Notes (Signed)
Subjective:    Patient ID: Sheri Rasmussen, female    DOB: May 16, 1981, 33 y.o.   MRN: [id number]  HPI  Patient here today for follow up.  Chronic medical issues reviewed.    Past Medical History  Diagnosis Date  . Morbid obesity   . SMOKER   . PNEUMONIA 10/2009    a/w ARDS/sepsis  . AMENORRHEA   . Allergy   . DIABETES MELLITUS, TYPE II      Review of Systems  Constitutional: Negative for fever, chills, activity change and appetite change.  Respiratory: Negative for apnea, shortness of breath and wheezing.   Cardiovascular: Negative for chest pain, palpitations and leg swelling.  Gastrointestinal: Negative for nausea, vomiting, diarrhea and constipation.  Endocrine: Negative for cold intolerance and heat intolerance.  Neurological: Negative for dizziness, syncope and headaches.       Objective:   Physical Exam  Vitals reviewed. Constitutional: She is oriented to person, place, and time. She appears well-developed and well-nourished. No distress.  obese  Neck: Normal range of motion. Neck supple. No thyromegaly present.  Cardiovascular: Normal rate, regular rhythm and normal heart sounds.   No murmur heard. Pulmonary/Chest: Effort normal and breath sounds normal. No respiratory distress. She has no wheezes.  Abdominal: Soft. Bowel sounds are normal. She exhibits no distension. There is no tenderness.  Musculoskeletal: Normal range of motion. She exhibits no edema and no tenderness.  Lymphadenopathy:    She has no cervical adenopathy.  Neurological: She is alert and oriented to person, place, and time.  Skin: Skin is warm and dry. No rash noted. She is not diaphoretic.  Psychiatric: She has a normal mood and affect. Felber behavior is normal. Judgment and thought content normal.    Wt Readings from Last 3 Encounters:  09/19/13 253 lb 9.6 oz (115.032 kg)  07/28/13 258 lb (117.028 kg)  01/28/13 261 lb 3.2 oz (118.48 kg)   BP Readings from Last 3 Encounters:  09/19/13 118/76    07/28/13 136/76  01/28/13 110/62   Lab Results  Component Value Date   WBC 14.8* 11/08/2010   HGB 15.4* 11/08/2010   HCT 43.8 11/08/2010   PLT 253.0 11/08/2010   GLUCOSE 243* 11/08/2010   CHOL 193 07/28/2012   TRIG 306.0* 07/28/2012   HDL 37.00* 07/28/2012   LDLDIRECT 106.7 07/28/2012   LDLCALC  Value: 42        Total Cholesterol/HDL:CHD Risk Coronary Heart Disease Risk Table                     Men   Women  1/2 Average Risk   3.4   3.3  Average Risk       5.0   4.4  2 X Average Risk   9.6   7.1  3 X Average Risk  23.4   11.0        Use the calculated Patient Ratio above and the CHD Risk Table to determine the patient's CHD Risk.        ATP III CLASSIFICATION (LDL):  <100     mg/dL   Optimal  100-129  mg/dL   Near or Above                    Optimal  130-159  mg/dL   Borderline  160-189  mg/dL   High  >190     mg/dL   Very High 10/26/2009   ALT 31 11/08/2010   AST 26 11/08/2010  NA 136 11/08/2010   K 4.3 11/08/2010   CL 99 11/08/2010   CREATININE 0.7 11/08/2010   BUN 18 11/08/2010   CO2 28 11/08/2010   TSH 1.79 11/08/2010   INR 1.13 10/25/2009   HGBA1C 7.5* 12/27/2012   MICROALBUR 2.6* 10/29/2011        Assessment & Plan:   Problem List Items Addressed This Visit   MORBID OBESITY - Primary      Reviewed need for weight reduction to control DM as well as other health issues Working with medical bariatric clinic on same - on phenteramine encouragement provided to continue same efforts and commended on prior success The patient is asked to make continued efforts at ongoing diet and exercise patterns to aid in medical management of this problem.  Wt Readings from Last 3 Encounters:  09/19/13 253 lb 9.6 oz (115.032 kg)  07/28/13 258 lb (117.028 kg)  01/28/13 261 lb 3.2 oz (118.48 kg)      Relevant Medications      phentermine 30 MG capsule   Type II or unspecified type diabetes mellitus with neurological manifestations, uncontrolled(250.62)      dx 10/2009 during hosp for ARDS improving  control by hx - weight mgmt remains issue Has been working with Endo since 07/2011, changed from [place] to [place] at pt request -  Reports last a1c 8 06/2013 -  reviewed need for continued med compliance and efforts at weight loss not on ACEI or statin due to desire for eventual pregnancy  Lab Results  Component Value Date   HGBA1C 7.5* 12/27/2012      Relevant Orders      Hemoglobin A1c      Lipid panel

## 2013-09-19 NOTE — Progress Notes (Signed)
Pre visit review using our clinic review tool, if applicable. No additional management support is needed unless otherwise documented below in the visit note. 

## 2013-09-19 NOTE — Patient Instructions (Signed)
It was good to see you today.  We have reviewed your prior records including labs and tests today  Test(s) ordered today. Return when you are fasting. Your results will be released to MyChart (or called to you) after review, usually within 72hours after test completion. If any changes need to be made, you will be notified at that same time.  Medications reviewed and updated, no changes recommended at this time.  Please schedule followup in 6 months, call sooner if problems.  

## 2013-09-19 NOTE — Assessment & Plan Note (Signed)
dx 10/2009 during hosp for ARDS improving control by hx - weight mgmt remains issue Has been working with Endo since 07/2011, changed from [place] to [place] at pt request -  Reports last a1c 8 06/2013 -  reviewed need for continued med compliance and efforts at weight loss not on ACEI or statin due to desire for eventual pregnancy  Lab Results  Component Value Date   HGBA1C 7.5* 12/27/2012

## 2013-09-20 ENCOUNTER — Telehealth: Payer: Self-pay | Admitting: Internal Medicine

## 2013-09-20 NOTE — Telephone Encounter (Signed)
Relevant patient education assigned to patient using Emmi. ° °

## 2013-09-22 ENCOUNTER — Other Ambulatory Visit (INDEPENDENT_AMBULATORY_CARE_PROVIDER_SITE_OTHER): Payer: BC Managed Care – PPO

## 2013-09-22 DIAGNOSIS — E1149 Type 2 diabetes mellitus with other diabetic neurological complication: Secondary | ICD-10-CM

## 2013-09-22 LAB — LIPID PANEL
Cholesterol: 175 mg/dL (ref 0–200)
HDL: 34.8 mg/dL — AB (ref >39.00)
LDL Cholesterol: 97 mg/dL (ref 0–99)
TRIGLYCERIDES: 214 mg/dL — AB (ref 0.0–149.0)
Total CHOL/HDL Ratio: 5
VLDL: 42.8 mg/dL — ABNORMAL HIGH (ref 0.0–40.0)

## 2013-09-22 LAB — HEMOGLOBIN A1C: Hgb A1c MFr Bld: 8.2 % — ABNORMAL HIGH (ref 4.6–6.5)

## 2013-10-03 ENCOUNTER — Telehealth: Payer: Self-pay

## 2013-10-03 NOTE — Telephone Encounter (Signed)
Relevant patient education assigned to patient using Emmi. ° °

## 2013-11-01 ENCOUNTER — Ambulatory Visit (INDEPENDENT_AMBULATORY_CARE_PROVIDER_SITE_OTHER): Payer: BC Managed Care – PPO | Admitting: Physician Assistant

## 2013-11-01 VITALS — BP 112/80 | HR 100 | Temp 98.2°F | Resp 18 | Ht 62.0 in | Wt 254.0 lb

## 2013-11-01 DIAGNOSIS — L723 Sebaceous cyst: Secondary | ICD-10-CM

## 2013-11-01 DIAGNOSIS — L0211 Cutaneous abscess of neck: Secondary | ICD-10-CM

## 2013-11-01 DIAGNOSIS — M542 Cervicalgia: Secondary | ICD-10-CM

## 2013-11-01 DIAGNOSIS — L03221 Cellulitis of neck: Principal | ICD-10-CM

## 2013-11-01 DIAGNOSIS — L089 Local infection of the skin and subcutaneous tissue, unspecified: Secondary | ICD-10-CM

## 2013-11-01 MED ORDER — DOXYCYCLINE HYCLATE 100 MG PO CAPS
100.0000 mg | ORAL_CAPSULE | Freq: Two times a day (BID) | ORAL | Status: DC
Start: 2013-11-01 — End: 2013-11-05

## 2013-11-01 MED ORDER — HYDROCODONE-ACETAMINOPHEN 5-325 MG PO TABS: 1.0000 | ORAL_TABLET | Freq: Three times a day (TID) | ORAL | Status: DC | PRN

## 2013-11-01 NOTE — Progress Notes (Signed)
   Subjective:    Patient ID: Sheri Rasmussen, female    DOB: 1980/08/03, 33 y.o.   MRN: [id number]  HPI 33 year old female presents for evaluation of a cyst on the back of Sheri Rasmussen neck. States it has been there for 2 days. She has had this happened before and so she applied warm compresses which "brought it to a head."  States it now feels "soft" in the middle and she thinks it may need to be lanced.  Has not noticed any drainage from it.  Denies fever, chills, nausea, vomiting, or headache. Does have radiating pain from the lesion and discomfort while turning Sheri Rasmussen neck.      Review of Systems  Constitutional: Negative for fever and chills.  Skin: Positive for color change.  Neurological: Negative for dizziness and headaches.       Objective:   Physical Exam  Constitutional: She is oriented to person, place, and time. She appears well-developed and well-nourished.  HENT:  Head: Normocephalic and atraumatic.  Right Ear: External ear normal.  Left Ear: External ear normal.  Eyes: Conjunctivae are normal.  Neck: Normal range of motion.  Cardiovascular: Normal rate.   Pulmonary/Chest: Effort normal.  Neurological: She is alert and oriented to person, place, and time.  Skin:     Psychiatric: She has a normal mood and affect. Sheri Rasmussen behavior is normal. Judgment and thought content normal.     Procedure: VCO. Local anesthesia with 2% lidocaine plain.  1 cm incision made along lesion.  Copious purulent material expressed.  There are several loculations that are indurated and unable to express further material. Packed with 5 cm of 1/4 plain packing.  Cleaned and bandaged. Patient tolerated ok.     Assessment & Plan:  Cellulitis and abscess of neck - Plan: Wound culture, doxycycline (VIBRAMYCIN) 100 MG capsule  Infected sebaceous cyst - Plan: doxycycline (VIBRAMYCIN) 100 MG capsule, Ambulatory referral to General Surgery  Pain, neck - Plan: HYDROcodone-acetaminophen (NORCO) 5-325 MG per  tablet  Wound care provided for patient Culture pending Start doxycycline 100 mg bid x 10 days Continue warm compresses 2-3 times daily Recheck in 48 hours.

## 2013-11-03 ENCOUNTER — Ambulatory Visit (INDEPENDENT_AMBULATORY_CARE_PROVIDER_SITE_OTHER): Payer: BC Managed Care – PPO | Admitting: Physician Assistant

## 2013-11-03 VITALS — BP 100/60 | Temp 98.6°F | Resp 18 | Ht 63.0 in | Wt 251.0 lb

## 2013-11-03 DIAGNOSIS — L0211 Cutaneous abscess of neck: Secondary | ICD-10-CM

## 2013-11-03 DIAGNOSIS — L03221 Cellulitis of neck: Principal | ICD-10-CM

## 2013-11-03 NOTE — Progress Notes (Signed)
Patient ID: Sheri Rasmussen MRN: [id number], DOB: 1980-09-12 33 y.o. Date of Encounter: 11/03/2013, 7:14 PM  Chief Complaint: Wound care   See previous note  HPI: 33 y.o. y/o female presents for wound care s/p I&D on 11/01/13 Doing well No issues or complaints Afebrile/ no chills No nausea or vomiting Tolerating doxycycline.  Pain improved significantly. Daily dressing change Previous note reviewed  Past Medical History  Diagnosis Date  . Morbid obesity   . SMOKER   . PNEUMONIA 10/2009    a/w ARDS/sepsis  . AMENORRHEA   . Allergy   . DIABETES MELLITUS, TYPE II      Home Meds: Prior to Admission medications   Medication Sig Start Date End Date Taking? Authorizing Provider  doxycycline (VIBRAMYCIN) 100 MG capsule Take 1 capsule (100 mg total) by mouth 2 (two) times daily. 11/01/13  Yes Camaya Gannett M Jacque Byron, PA-C  HUMALOG KWIKPEN 100 UNIT/ML injection Take three times a day (35-20-45) 07/26/12  Yes Historical Provider, MD  HYDROcodone-acetaminophen (NORCO) 5-325 MG per tablet Take 1 tablet by mouth every 8 (eight) hours as needed. 11/01/13  Yes Irvin Lizama M Semir Brill, PA-C  insulin detemir (LEVEMIR) 100 UNIT/ML injection Inject 0.8 mL (80 Units total) into the skin at bedtime. 01/28/13  Yes Rowe Clack, MD  Insulin Pen Needle 32G X 4 MM MISC Use as directed three times daily 12/15/12  Yes Renato Shin, MD  metFORMIN (GLUCOPHAGE-XR) 500 MG 24 hr tablet Take 1 by mouth twice a day 07/09/12  Yes Historical Provider, MD  ONE TOUCH ULTRA TEST test strip Use as directed 07/09/12  Yes Historical Provider, MD  Jonetta Speak LANCETS 61W MISC Use as directed 07/09/12  Yes Historical Provider, MD  phentermine 30 MG capsule Take 30 mg by mouth every morning.   Yes Historical Provider, MD    Allergies: No Known Allergies  ROS: Constitutional: Afebrile, no chills Cardiovascular: negative for chest pain or palpitations Dermatological: Positive for wound. Negative for erythema, pain, or warmth GI: No nausea or  vomiting   EXAM: Physical Exam: Blood pressure 100/60, temperature 98.6 F (37 C), temperature source Oral, resp. rate 18, height 5\' 3"  (1.6 m), weight 251 lb (113.853 kg), last menstrual period 10/10/2013., Body mass index is 44.47 kg/(m^2). General: Well developed, well nourished, in no acute distress. Nontoxic appearing. Head: Normocephalic, atraumatic, sclera non-icteric.  Neck: Supple. Lungs: Breathing is unlabored. Heart: Normal rate. Skin:  Warm and moist. Dressing and packing in place. There is surrounging induration but no fluctuance. No erythema or tenderness to palpation. Neuro: Alert and oriented X 3. Moves all extremities spontaneously. Normal gait.  Psych:  Responds to questions appropriately with a normal affect.       PROCEDURE: Dressing and packing removed. No purulence expressed Small amount of sebaceous material removed.  Wound bed healthy Irrigated with 1% plain lidocaine 5 cc. Repacked with 1/4 plain packing.  Dressing applied  LAB: Culture: no growth.   A/P: 33 y.o. y/o female with neck cellulitis/abscess as above s/p I&D on 11/01/13.  Wound care per above Continue doxycycline.  Pain well controlled Daily dressing changes Recheck 48 hours Patient has appt with General Surgeon on 11/14/13 for eval  Signed, Georgiann Mccoy, PA-C 11/03/2013 7:14 PM

## 2013-11-04 LAB — WOUND CULTURE
Gram Stain: NONE SEEN
Gram Stain: NONE SEEN
Organism ID, Bacteria: NO GROWTH

## 2013-11-05 ENCOUNTER — Ambulatory Visit (INDEPENDENT_AMBULATORY_CARE_PROVIDER_SITE_OTHER): Payer: BC Managed Care – PPO | Admitting: Physician Assistant

## 2013-11-05 VITALS — BP 102/70 | HR 102 | Temp 97.6°F | Ht 63.0 in | Wt 254.8 lb

## 2013-11-05 DIAGNOSIS — L0211 Cutaneous abscess of neck: Secondary | ICD-10-CM

## 2013-11-05 DIAGNOSIS — L03221 Cellulitis of neck: Principal | ICD-10-CM

## 2013-11-06 NOTE — Progress Notes (Signed)
   Subjective:    Patient ID: Sheri Rasmussen, female    DOB: 1980/07/24, 33 y.o.   MRN: [id number]  HPI   Sheri Rasmussen is a very pleasant 33 yr old female here for follow up on cellulitis and abscess of neck.  Abscess was drained here 11/01/13.  She has been taking doxycycline.  She has been referred to surgery for further evaluation as this has been a recurrent problem and she has several indurated areas around the incised lesion.  She has no fever, chills, nausea, vomiting.  She reports that she is improving.   Review of Systems  Constitutional: Negative for fever and chills.  Respiratory: Negative.   Cardiovascular: Negative.   Gastrointestinal: Negative for nausea and vomiting.  Skin: Positive for wound.       Objective:   Physical Exam  Vitals reviewed. Constitutional: She is oriented to person, place, and time. She appears well-developed and well-nourished. No distress.  HENT:  Head: Normocephalic and atraumatic.  Eyes: Conjunctivae are normal. No scleral icterus.  Pulmonary/Chest: Effort normal.  Neurological: She is alert and oriented to person, place, and time.  Skin: Skin is warm and dry.     Healing incision at posterior left neck; packing removed; serosanguinous material expressed; there are multiple indurated areas around the incision - none fluctuant, only mildly tender; no surrounding cellulitis; ?whether there maybe underling sebaceous cyst(s)  Psychiatric: She has a normal mood and affect. Eckenrode behavior is normal.       Assessment & Plan:  Cellulitis and abscess of neck   Sheri Rasmussen is a very pleasant 33 yr old female [place] for follow up after I&D.  Wound appears to be healing well.  No purulent drainage.  No surrounding cellulitis.  I have not repacked today.  Continue dressing changes until completely healed.  Wound cx with no growth - can stop abx.  Follow up with [facility] as planned for discussion of more definitive treatment.  RTC if concerns prior to surg eval  Pt to call or RTC  if worsening or not improving  E. Natividad Brood MHS, PA-C [facility] 5/24/201511:36 AM

## 2013-11-14 ENCOUNTER — Ambulatory Visit (INDEPENDENT_AMBULATORY_CARE_PROVIDER_SITE_OTHER): Payer: BC Managed Care – PPO | Admitting: General Surgery

## 2013-11-14 ENCOUNTER — Encounter (INDEPENDENT_AMBULATORY_CARE_PROVIDER_SITE_OTHER): Payer: Self-pay | Admitting: General Surgery

## 2013-11-14 VITALS — BP 128/86 | HR 80 | Temp 98.6°F | Resp 16 | Ht 63.0 in | Wt 258.2 lb

## 2013-11-14 DIAGNOSIS — L723 Sebaceous cyst: Secondary | ICD-10-CM

## 2013-11-14 MED ORDER — SULFAMETHOXAZOLE-TRIMETHOPRIM 400-80 MG PO TABS: 1.0000 | ORAL_TABLET | Freq: Two times a day (BID) | ORAL | Status: AC

## 2013-11-14 NOTE — Progress Notes (Signed)
Patient ID: Sheri Rasmussen, female   DOB: 04/15/1981, 33 y.o.   MRN: [id number]  Chief Complaint  Patient presents with  . Cyst    HPI Sheri Rasmussen is a 33 y.o. female.  The patient is a 33-year-old female who is referred by Dr. Marte for evaluation of the left posterior neck sebaceous cyst. Patient recently had this drained approximately a year ago and has recurred. She states that she has some tenderness in the area however there is limited some serous drainage coming from the area.  HPI  Past Medical History  Diagnosis Date  . Morbid obesity   . SMOKER   . PNEUMONIA 10/2009    a/w ARDS/sepsis  . AMENORRHEA   . Allergy   . DIABETES MELLITUS, TYPE II     No past surgical history on file.  Family History  Problem Relation Age of Onset  . Diabetes Mother     gestational  . Cervical cancer Paternal Grandmother     Social History History  Substance Use Topics  . Smoking status: Current Every Day Smoker -- 10 years    Types: Cigarettes  . Smokeless tobacco: Never Used     Comment: Hmong heritage. married summer 2012. working @ [employer]  . Alcohol Use: Yes     Comment: once a month or less, mixed drinks    No Known Allergies  Current Outpatient Prescriptions  Medication Sig Dispense Refill  . HUMALOG KWIKPEN 100 UNIT/ML injection Take three times a day (35-20-45)      . HYDROcodone-acetaminophen (NORCO) 5-325 MG per tablet Take 1 tablet by mouth every 8 (eight) hours as needed.  20 tablet  0  . insulin detemir (LEVEMIR) 100 UNIT/ML injection Inject 0.8 mL (80 Units total) into the skin at bedtime.  10 mL  3  . Insulin Pen Needle 32G X 4 MM MISC Use as directed three times daily  300 each  1  . metFORMIN (GLUCOPHAGE-XR) 500 MG 24 hr tablet Take 1 by mouth twice a day      . ONE TOUCH ULTRA TEST test strip Use as directed      . ONETOUCH DELICA LANCETS 33G MISC Use as directed      . phentermine 30 MG capsule Take 30 mg by mouth every morning.       No current  facility-administered medications for this visit.    Review of Systems Review of Systems  Constitutional: Negative.   HENT: Negative.   Respiratory: Negative.   Cardiovascular: Negative.   Gastrointestinal: Negative.   Neurological: Negative.   All other systems reviewed and are negative.   Blood pressure 128/86, pulse 80, temperature 98.6 F (37 C), temperature source Temporal, resp. rate 16, height 5' 3" (1.6 m), weight 258 lb 3.2 oz (117.119 kg), last menstrual period 10/10/2013.  Physical Exam Physical Exam  Constitutional: She is oriented to person, place, and time. She appears well-developed and well-nourished.  HENT:  Head: Normocephalic and atraumatic.  Eyes: Conjunctivae and EOM are normal. Pupils are equal, round, and reactive to light.  Neck: Normal range of motion. Neck supple.    Cardiovascular: Normal rate, regular rhythm and normal heart sounds.   Pulmonary/Chest: Effort normal and breath sounds normal.  Abdominal: Soft. Bowel sounds are normal.  Musculoskeletal: Normal range of motion.  Neurological: She is alert and oriented to person, place, and time.  Skin: Skin is warm and dry.  Psychiatric: She has a normal mood and affect.    Data Reviewed none    Assessment    33-year-old female with a posterior neck sebaceous cyst     Plan    1. We'll proceed to the operating room for excision of the posterior neck sebaceous cyst. 2. I discussed with the patient risks and benefits of the procedure to include but not limited to infection, bleeding, damage to structures, and possible recurrence. Patient voiced understanding and wishes to proceed.  Preoperative the patient prescription for Bactrim to avoid any further soft tissue infection       Sheri Rasmussen 11/14/2013, 3:50 PM    

## 2013-12-05 ENCOUNTER — Encounter (HOSPITAL_BASED_OUTPATIENT_CLINIC_OR_DEPARTMENT_OTHER): Payer: Self-pay | Admitting: *Deleted

## 2013-12-05 NOTE — Pre-Procedure Instructions (Signed)
Bring all medications. Plans to come in wed. For Bmet and Ekg.

## 2013-12-06 ENCOUNTER — Other Ambulatory Visit (INDEPENDENT_AMBULATORY_CARE_PROVIDER_SITE_OTHER): Payer: Self-pay | Admitting: General Surgery

## 2013-12-06 NOTE — Pre-Procedure Instructions (Signed)
Notified Annie at Dr. Johney Frame office to have him put orders in computer please.

## 2013-12-07 ENCOUNTER — Encounter (HOSPITAL_BASED_OUTPATIENT_CLINIC_OR_DEPARTMENT_OTHER)
Admission: RE | Admit: 2013-12-07 | Discharge: 2013-12-07 | Disposition: A | Discharge status: Home / Self Care | Payer: BC Managed Care – PPO | Source: Ambulatory Visit | Attending: General Surgery | Admitting: General Surgery

## 2013-12-07 ENCOUNTER — Other Ambulatory Visit: Payer: Self-pay

## 2013-12-07 DIAGNOSIS — Z01810 Encounter for preprocedural cardiovascular examination: Secondary | ICD-10-CM | POA: Insufficient documentation

## 2013-12-07 LAB — BASIC METABOLIC PANEL
BUN: 16 mg/dL (ref 6–23)
CHLORIDE: 102 meq/L (ref 96–112)
CO2: 23 meq/L (ref 19–32)
Calcium: 9.2 mg/dL (ref 8.4–10.5)
Creatinine, Ser: 0.6 mg/dL (ref 0.50–1.10)
GFR calc non Af Amer: >90 mL/min (ref >90)
Glucose, Bld: 127 mg/dL — ABNORMAL HIGH (ref 70–99)
POTASSIUM: 4 meq/L (ref 3.7–5.3)
SODIUM: 140 meq/L (ref 137–147)

## 2013-12-12 ENCOUNTER — Ambulatory Visit (HOSPITAL_BASED_OUTPATIENT_CLINIC_OR_DEPARTMENT_OTHER): Payer: BC Managed Care – PPO | Admitting: Anesthesiology

## 2013-12-12 ENCOUNTER — Encounter (HOSPITAL_BASED_OUTPATIENT_CLINIC_OR_DEPARTMENT_OTHER): Payer: Self-pay

## 2013-12-12 ENCOUNTER — Encounter (HOSPITAL_BASED_OUTPATIENT_CLINIC_OR_DEPARTMENT_OTHER): Payer: BC Managed Care – PPO | Admitting: Anesthesiology

## 2013-12-12 ENCOUNTER — Inpatient Hospital Stay (HOSPITAL_COMMUNITY): Payer: BC Managed Care – PPO

## 2013-12-12 ENCOUNTER — Encounter (HOSPITAL_COMMUNITY)
Admission: RE | Disposition: A | Discharge status: Home / Self Care | Payer: Self-pay | Source: Ambulatory Visit | Attending: Pulmonary Disease

## 2013-12-12 ENCOUNTER — Inpatient Hospital Stay (HOSPITAL_BASED_OUTPATIENT_CLINIC_OR_DEPARTMENT_OTHER)
Admission: RE | Admit: 2013-12-12 | Discharge: 2013-12-13 | DRG: 919 | Disposition: A | Discharge status: Home / Self Care | Payer: BC Managed Care – PPO | Source: Ambulatory Visit | Attending: Internal Medicine | Admitting: Internal Medicine

## 2013-12-12 DIAGNOSIS — Z833 Family history of diabetes mellitus: Secondary | ICD-10-CM

## 2013-12-12 DIAGNOSIS — E119 Type 2 diabetes mellitus without complications: Secondary | ICD-10-CM | POA: Diagnosis present

## 2013-12-12 DIAGNOSIS — J189 Pneumonia, unspecified organism: Secondary | ICD-10-CM

## 2013-12-12 DIAGNOSIS — J9600 Acute respiratory failure, unspecified whether with hypoxia or hypercapnia: Secondary | ICD-10-CM

## 2013-12-12 DIAGNOSIS — J9601 Acute respiratory failure with hypoxia: Secondary | ICD-10-CM

## 2013-12-12 DIAGNOSIS — J9690 Respiratory failure, unspecified, unspecified whether with hypoxia or hypercapnia: Secondary | ICD-10-CM | POA: Diagnosis present

## 2013-12-12 DIAGNOSIS — IMO0002 Reserved for concepts with insufficient information to code with codable children: Principal | ICD-10-CM | POA: Diagnosis present

## 2013-12-12 DIAGNOSIS — R0609 Other forms of dyspnea: Secondary | ICD-10-CM

## 2013-12-12 DIAGNOSIS — E1149 Type 2 diabetes mellitus with other diabetic neurological complication: Secondary | ICD-10-CM

## 2013-12-12 DIAGNOSIS — Z794 Long term (current) use of insulin: Secondary | ICD-10-CM

## 2013-12-12 DIAGNOSIS — N912 Amenorrhea, unspecified: Secondary | ICD-10-CM

## 2013-12-12 DIAGNOSIS — J9610 Chronic respiratory failure, unspecified whether with hypoxia or hypercapnia: Secondary | ICD-10-CM

## 2013-12-12 DIAGNOSIS — G9340 Encephalopathy, unspecified: Secondary | ICD-10-CM | POA: Diagnosis present

## 2013-12-12 DIAGNOSIS — F17200 Nicotine dependence, unspecified, uncomplicated: Secondary | ICD-10-CM | POA: Diagnosis present

## 2013-12-12 DIAGNOSIS — Z5309 Procedure and treatment not carried out because of other contraindication: Secondary | ICD-10-CM

## 2013-12-12 DIAGNOSIS — Z6841 Body Mass Index (BMI) 40.0 and over, adult: Secondary | ICD-10-CM

## 2013-12-12 DIAGNOSIS — Z8049 Family history of malignant neoplasm of other genital organs: Secondary | ICD-10-CM

## 2013-12-12 DIAGNOSIS — L723 Sebaceous cyst: Secondary | ICD-10-CM | POA: Diagnosis present

## 2013-12-12 DIAGNOSIS — R0989 Other specified symptoms and signs involving the circulatory and respiratory systems: Secondary | ICD-10-CM

## 2013-12-12 DIAGNOSIS — D62 Acute posthemorrhagic anemia: Secondary | ICD-10-CM | POA: Diagnosis present

## 2013-12-12 HISTORY — PX: EAR CYST EXCISION: SHX22

## 2013-12-12 LAB — COMPREHENSIVE METABOLIC PANEL
ALBUMIN: 3.7 g/dL (ref 3.5–5.2)
ALT: 30 U/L (ref 0–35)
AST: 20 U/L (ref 0–37)
Alkaline Phosphatase: 76 U/L (ref 39–117)
BUN: 9 mg/dL (ref 6–23)
CALCIUM: 8.8 mg/dL (ref 8.4–10.5)
CO2: 25 mEq/L (ref 19–32)
CREATININE: 0.59 mg/dL (ref 0.50–1.10)
Chloride: 99 mEq/L (ref 96–112)
GFR calc non Af Amer: >90 mL/min (ref >90)
Glucose, Bld: 208 mg/dL — ABNORMAL HIGH (ref 70–99)
Potassium: 4.5 mEq/L (ref 3.7–5.3)
Sodium: 136 mEq/L — ABNORMAL LOW (ref 137–147)
Total Bilirubin: 0.3 mg/dL (ref 0.3–1.2)
Total Protein: 7.3 g/dL (ref 6.0–8.3)

## 2013-12-12 LAB — CBC
HCT: 39.3 % (ref 36.0–46.0)
Hemoglobin: 13.3 g/dL (ref 12.0–15.0)
MCH: 30.8 pg (ref 26.0–34.0)
MCHC: 33.8 g/dL (ref 30.0–36.0)
MCV: 91 fL (ref 78.0–100.0)
PLATELETS: 214 10*3/uL (ref 150–400)
RBC: 4.32 MIL/uL (ref 3.87–5.11)
RDW: 13.4 % (ref 11.5–15.5)
WBC: 14.5 10*3/uL — AB (ref 4.0–10.5)

## 2013-12-12 LAB — GLUCOSE, CAPILLARY
GLUCOSE-CAPILLARY: 155 mg/dL — AB (ref 70–99)
GLUCOSE-CAPILLARY: 222 mg/dL — AB (ref 70–99)
Glucose-Capillary: 221 mg/dL — ABNORMAL HIGH (ref 70–99)
Glucose-Capillary: 203 mg/dL — ABNORMAL HIGH (ref 70–99)

## 2013-12-12 LAB — MRSA PCR SCREENING: MRSA by PCR: NEGATIVE

## 2013-12-12 LAB — URINE MICROSCOPIC-ADD ON

## 2013-12-12 LAB — POCT I-STAT 3, ART BLOOD GAS (G3+)
ACID-BASE DEFICIT: 2 mmol/L (ref 0.0–2.0)
Bicarbonate: 24.6 mEq/L — ABNORMAL HIGH (ref 20.0–24.0)
O2 SAT: 97 %
TCO2: 26 mmol/L (ref 0–100)
pCO2 arterial: 47.2 mmHg — ABNORMAL HIGH (ref 35.0–45.0)
pH, Arterial: 7.324 — ABNORMAL LOW (ref 7.350–7.450)
pO2, Arterial: 97 mmHg (ref 80.0–100.0)

## 2013-12-12 LAB — APTT: APTT: 27 s (ref 24–37)

## 2013-12-12 LAB — MAGNESIUM: Magnesium: 1.5 mg/dL (ref 1.5–2.5)

## 2013-12-12 LAB — URINALYSIS, ROUTINE W REFLEX MICROSCOPIC
Bilirubin Urine: NEGATIVE
Glucose, UA: NEGATIVE mg/dL
Hgb urine dipstick: NEGATIVE
KETONES UR: NEGATIVE mg/dL
LEUKOCYTES UA: NEGATIVE
NITRITE: NEGATIVE
Protein, ur: 30 mg/dL — AB
Specific Gravity, Urine: 1.022 (ref 1.005–1.030)
UROBILINOGEN UA: 0.2 mg/dL (ref 0.0–1.0)
pH: 6.5 (ref 5.0–8.0)

## 2013-12-12 LAB — PROTIME-INR
INR: 1 (ref 0.00–1.49)
Prothrombin Time: 13.2 seconds (ref 11.6–15.2)

## 2013-12-12 LAB — PHOSPHORUS: Phosphorus: 4.3 mg/dL (ref 2.3–4.6)

## 2013-12-12 LAB — POCT HEMOGLOBIN-HEMACUE: Hemoglobin: 15.2 g/dL — ABNORMAL HIGH (ref 12.0–15.0)

## 2013-12-12 SURGERY — CYST REMOVAL
Anesthesia: General | Site: Throat | Laterality: Left

## 2013-12-12 MED ORDER — LIDOCAINE HCL (PF) 1 % IJ SOLN
INTRAMUSCULAR | Status: AC
  Filled 2013-12-12: qty 30

## 2013-12-12 MED ORDER — MIDAZOLAM HCL 5 MG/5ML IJ SOLN
INTRAMUSCULAR | Status: DC | PRN
Start: 2013-12-12 — End: 2013-12-12
  Administered 2013-12-12 (×3): 2 mg via INTRAVENOUS

## 2013-12-12 MED ORDER — DEXAMETHASONE SODIUM PHOSPHATE 4 MG/ML IJ SOLN
INTRAMUSCULAR | Status: DC | PRN
  Administered 2013-12-12: 8 mg via INTRAVENOUS

## 2013-12-12 MED ORDER — FENTANYL CITRATE 0.05 MG/ML IJ SOLN
INTRAMUSCULAR | Status: AC
  Filled 2013-12-12: qty 2

## 2013-12-12 MED ORDER — BIOTENE DRY MOUTH MT LIQD
15.0000 mL | Freq: Four times a day (QID) | OROMUCOSAL | Status: DC
  Administered 2013-12-13 (×2): 15 mL via OROMUCOSAL

## 2013-12-12 MED ORDER — FENTANYL CITRATE 0.05 MG/ML IJ SOLN
INTRAMUSCULAR | Status: AC
  Filled 2013-12-12: qty 6

## 2013-12-12 MED ORDER — FENTANYL CITRATE 0.05 MG/ML IJ SOLN: 50.0000 ug | INTRAMUSCULAR | Status: DC | PRN

## 2013-12-12 MED ORDER — FENTANYL CITRATE 0.05 MG/ML IJ SOLN
100.0000 ug | Freq: Once | INTRAMUSCULAR | Status: AC
  Administered 2013-12-12: 100 ug via INTRAVENOUS

## 2013-12-12 MED ORDER — MIDAZOLAM HCL 2 MG/2ML IJ SOLN
2.0000 mg | Freq: Once | INTRAMUSCULAR | Status: AC
  Administered 2013-12-12: 2 mg via INTRAVENOUS

## 2013-12-12 MED ORDER — FENTANYL CITRATE 0.05 MG/ML IJ SOLN
50.0000 ug | Freq: Once | INTRAMUSCULAR | Status: AC
  Administered 2013-12-12: 50 ug via INTRAVENOUS

## 2013-12-12 MED ORDER — OXYCODONE HCL 5 MG/5ML PO SOLN: 5.0000 mg | Freq: Once | ORAL | Status: DC | PRN

## 2013-12-12 MED ORDER — ALBUTEROL SULFATE (2.5 MG/3ML) 0.083% IN NEBU: 2.5000 mg | INHALATION_SOLUTION | RESPIRATORY_TRACT | Status: DC | PRN

## 2013-12-12 MED ORDER — MIDAZOLAM HCL 2 MG/2ML IJ SOLN: 1.0000 mg | INTRAMUSCULAR | Status: DC | PRN

## 2013-12-12 MED ORDER — BUPIVACAINE HCL (PF) 0.25 % IJ SOLN
INTRAMUSCULAR | Status: AC
  Filled 2013-12-12: qty 30

## 2013-12-12 MED ORDER — INSULIN ASPART 100 UNIT/ML ~~LOC~~ SOLN
0.0000 [IU] | SUBCUTANEOUS | Status: DC
  Administered 2013-12-12: 5 [IU] via SUBCUTANEOUS
  Administered 2013-12-12: 3 [IU] via SUBCUTANEOUS
  Administered 2013-12-13 (×2): 2 [IU] via SUBCUTANEOUS

## 2013-12-12 MED ORDER — ROCURONIUM BROMIDE 100 MG/10ML IV SOLN
INTRAVENOUS | Status: DC | PRN
  Administered 2013-12-12: 30 mg via INTRAVENOUS
  Administered 2013-12-12: 20 mg via INTRAVENOUS

## 2013-12-12 MED ORDER — CEFAZOLIN SODIUM-DEXTROSE 2-3 GM-% IV SOLR
INTRAVENOUS | Status: AC
  Filled 2013-12-12: qty 50

## 2013-12-12 MED ORDER — SODIUM CHLORIDE 0.9 % IV SOLN
INTRAVENOUS | Status: DC
  Administered 2013-12-12 – 2013-12-13 (×2): via INTRAVENOUS

## 2013-12-12 MED ORDER — MIDAZOLAM HCL 2 MG/2ML IJ SOLN
INTRAMUSCULAR | Status: AC
  Filled 2013-12-12: qty 2

## 2013-12-12 MED ORDER — PROMETHAZINE HCL 25 MG/ML IJ SOLN: 6.2500 mg | INTRAMUSCULAR | Status: DC | PRN

## 2013-12-12 MED ORDER — FENTANYL BOLUS VIA INFUSION
50.0000 ug | INTRAVENOUS | Status: DC | PRN
  Administered 2013-12-13 (×2): 50 ug via INTRAVENOUS
  Filled 2013-12-12: qty 100

## 2013-12-12 MED ORDER — CEFAZOLIN SODIUM-DEXTROSE 2-3 GM-% IV SOLR
2.0000 g | INTRAVENOUS | Status: DC
  Administered 2013-12-12: 2 g via INTRAVENOUS

## 2013-12-12 MED ORDER — CHLORHEXIDINE GLUCONATE 0.12 % MT SOLN
15.0000 mL | Freq: Two times a day (BID) | OROMUCOSAL | Status: DC
  Administered 2013-12-12 – 2013-12-13 (×2): 15 mL via OROMUCOSAL
  Filled 2013-12-12 (×2): qty 15

## 2013-12-12 MED ORDER — LACTATED RINGERS IV SOLN
INTRAVENOUS | Status: DC
  Administered 2013-12-12: 11:00:00 via INTRAVENOUS

## 2013-12-12 MED ORDER — SODIUM CHLORIDE 0.9 % IV SOLN
0.0000 ug/h | INTRAVENOUS | Status: DC
  Administered 2013-12-12: 25 ug/h via INTRAVENOUS
  Administered 2013-12-13: 150 ug/h via INTRAVENOUS
  Filled 2013-12-12 (×2): qty 50

## 2013-12-12 MED ORDER — HYDROMORPHONE HCL PF 1 MG/ML IJ SOLN: 0.2500 mg | INTRAMUSCULAR | Status: DC | PRN

## 2013-12-12 MED ORDER — OXYCODONE HCL 5 MG PO TABS: 5.0000 mg | ORAL_TABLET | Freq: Once | ORAL | Status: DC | PRN

## 2013-12-12 MED ORDER — LIDOCAINE HCL (CARDIAC) 20 MG/ML IV SOLN
INTRAVENOUS | Status: DC | PRN
  Administered 2013-12-12: 100 mg via INTRAVENOUS

## 2013-12-12 MED ORDER — MIDAZOLAM HCL 10 MG/2ML IJ SOLN: 1.0000 mg | INTRAMUSCULAR | Status: DC | PRN

## 2013-12-12 MED ORDER — OXYMETAZOLINE HCL 0.05 % NA SOLN
NASAL | Status: AC
  Filled 2013-12-12: qty 15

## 2013-12-12 MED ORDER — CHLORHEXIDINE GLUCONATE 4 % EX LIQD: 1.0000 "application " | Freq: Once | CUTANEOUS | Status: DC

## 2013-12-12 MED ORDER — SUCCINYLCHOLINE CHLORIDE 20 MG/ML IJ SOLN
INTRAMUSCULAR | Status: DC | PRN
  Administered 2013-12-12: 100 mg via INTRAVENOUS

## 2013-12-12 MED ORDER — PANTOPRAZOLE SODIUM 40 MG IV SOLR
40.0000 mg | Freq: Every day | INTRAVENOUS | Status: DC
  Administered 2013-12-12: 40 mg via INTRAVENOUS
  Filled 2013-12-12 (×2): qty 40

## 2013-12-12 MED ORDER — PROPOFOL 10 MG/ML IV BOLUS
INTRAVENOUS | Status: DC | PRN
  Administered 2013-12-12: 200 mg via INTRAVENOUS

## 2013-12-12 MED ORDER — MIDAZOLAM HCL 2 MG/2ML IJ SOLN
1.0000 mg | Freq: Once | INTRAMUSCULAR | Status: AC
  Administered 2013-12-12: 1 mg via INTRAVENOUS

## 2013-12-12 MED ORDER — FENTANYL CITRATE 0.05 MG/ML IJ SOLN: 50.0000 ug | Freq: Once | INTRAMUSCULAR | Status: DC

## 2013-12-12 SURGICAL SUPPLY — 48 items
APPLIER CLIP 9.375 MED OPEN (MISCELLANEOUS)
BENZOIN TINCTURE PRP APPL 2/3 (GAUZE/BANDAGES/DRESSINGS) ×3 IMPLANT
BLADE SURG 15 STRL LF DISP TIS (BLADE) ×1 IMPLANT
BLADE SURG 15 STRL SS (BLADE) ×2
CANISTER SUCT 1200ML W/VALVE (MISCELLANEOUS) IMPLANT
CHLORAPREP W/TINT 26ML (MISCELLANEOUS) ×3 IMPLANT
CLIP APPLIE 9.375 MED OPEN (MISCELLANEOUS) IMPLANT
CLOSURE WOUND 1/2 X4 (GAUZE/BANDAGES/DRESSINGS) ×1
COVER MAYO STAND STRL (DRAPES) ×3 IMPLANT
COVER TABLE BACK 60X90 (DRAPES) ×3 IMPLANT
DECANTER SPIKE VIAL GLASS SM (MISCELLANEOUS) IMPLANT
DERMABOND ADVANCED (GAUZE/BANDAGES/DRESSINGS)
DERMABOND ADVANCED .7 DNX12 (GAUZE/BANDAGES/DRESSINGS) IMPLANT
DRAPE PED LAPAROTOMY (DRAPES) ×3 IMPLANT
DRSG TEGADERM 4X4.75 (GAUZE/BANDAGES/DRESSINGS) ×3 IMPLANT
ELECT COATED BLADE 2.86 ST (ELECTRODE) ×3 IMPLANT
ELECT REM PT RETURN 9FT ADLT (ELECTROSURGICAL) ×3
ELECTRODE REM PT RTRN 9FT ADLT (ELECTROSURGICAL) ×1 IMPLANT
GLOVE BIO SURGEON STRL SZ7.5 (GLOVE) ×3 IMPLANT
GLOVE BIOGEL PI IND STRL 7.0 (GLOVE) ×1 IMPLANT
GLOVE BIOGEL PI IND STRL 8 (GLOVE) ×1 IMPLANT
GLOVE BIOGEL PI INDICATOR 7.0 (GLOVE) ×2
GLOVE BIOGEL PI INDICATOR 8 (GLOVE) ×2
GOWN STRL REUS W/ TWL LRG LVL3 (GOWN DISPOSABLE) ×1 IMPLANT
GOWN STRL REUS W/TWL LRG LVL3 (GOWN DISPOSABLE) ×2
NEEDLE HYPO 25X1 1.5 SAFETY (NEEDLE) ×3 IMPLANT
NS IRRIG 1000ML POUR BTL (IV SOLUTION) ×3 IMPLANT
PACK BASIN DAY SURGERY FS (CUSTOM PROCEDURE TRAY) ×3 IMPLANT
PENCIL BUTTON HOLSTER BLD 10FT (ELECTRODE) ×3 IMPLANT
SLEEVE SCD COMPRESS KNEE MED (MISCELLANEOUS) ×3 IMPLANT
SPONGE GAUZE 4X4 12PLY STER LF (GAUZE/BANDAGES/DRESSINGS) ×3 IMPLANT
SPONGE LAP 4X18 X RAY DECT (DISPOSABLE) ×3 IMPLANT
STRIP CLOSURE SKIN 1/2X4 (GAUZE/BANDAGES/DRESSINGS) ×2 IMPLANT
SUT MNCRL AB 3-0 PS2 18 (SUTURE) IMPLANT
SUT MNCRL AB 4-0 PS2 18 (SUTURE) IMPLANT
SUT SILK 2 0 SH (SUTURE) IMPLANT
SUT VIC AB 2-0 SH 27 (SUTURE)
SUT VIC AB 2-0 SH 27XBRD (SUTURE) IMPLANT
SUT VIC AB 3-0 SH 27 (SUTURE)
SUT VIC AB 3-0 SH 27X BRD (SUTURE) IMPLANT
SUT VIC AB 5-0 PS2 18 (SUTURE) IMPLANT
SUT VICRYL AB 3 0 TIES (SUTURE) IMPLANT
SYR CONTROL 10ML LL (SYRINGE) ×3 IMPLANT
TOWEL OR 17X24 6PK STRL BLUE (TOWEL DISPOSABLE) ×3 IMPLANT
TOWEL OR NON WOVEN STRL DISP B (DISPOSABLE) ×3 IMPLANT
TUBE CONNECTING 20'X1/4 (TUBING)
TUBE CONNECTING 20X1/4 (TUBING) IMPLANT
YANKAUER SUCT BULB TIP NO VENT (SUCTIONS) ×3 IMPLANT

## 2013-12-12 NOTE — Interval H&P Note (Signed)
History and Physical Interval Note:  12/12/2013 11:17 AM  Sheri Rasmussen  has presented today for surgery, with the diagnosis of sebaceous cyst of left neck  The various methods of treatment have been discussed with the patient and family. After consideration of risks, benefits and other options for treatment, the patient has consented to  Procedure(s): EXCISION OF SEBACEOUS CYSTOF LEFT NECK  (Left) as a surgical intervention .  The patient's history has been reviewed, patient examined, no change in status, stable for surgery.  I have reviewed the patient's chart and labs.  Questions were answered to the patient's satisfaction.     Rosario Jacks., Anne Hahn

## 2013-12-12 NOTE — Procedures (Signed)
Central Venous Catheter Insertion Procedure Note Latara Hilbun [id number] [birth year]  Procedure: Insertion of Central Venous Catheter Indications: Assessment of intravascular volume, Drug and/or fluid administration and Frequent blood sampling  Procedure Details Consent: Unable to obtain consent because of altered level of consciousness. Time Out: Verified patient identification, verified procedure, site/side was marked, verified correct patient position, special equipment/implants available, medications/allergies/relevent history reviewed, required imaging and test results available.  Performed  Maximum sterile technique was used including antiseptics, cap, gloves, gown, hand hygiene, mask and sheet. Skin prep: Chlorhexidine; local anesthetic administered A antimicrobial bonded/coated triple lumen catheter was placed in the right internal jugular vein using the Seldinger technique.  Evaluation Blood flow good Complications: No apparent complications Patient did tolerate procedure well. Chest X-ray ordered to verify placement.  CXR: normal.  CVL pulled back 6 cm based off post procedure CXR.  Procedure performed under direct ultrasound guidance for real time vessel cannulation.      Montey Hora, [facility] Pgr: [phone]  or [phone]

## 2013-12-12 NOTE — Brief Op Note (Signed)
12/12/2013  12:54 PM  PATIENT:  Sheri Rasmussen  33 y.o. female  PRE-OPERATIVE DIAGNOSIS:  Pharyngeal tear, sebaceous cyst of left neck  POST-OPERATIVE DIAGNOSIS:  Same  PROCEDURE:  Procedure(s): Direct laryngoscopy  SURGEON:  Surgeon(s) and Role:  [employer] Teoh    PHYSICIAN ASSISTANT:   ASSISTANTS: none   ANESTHESIA:   general  EBL:     BLOOD ADMINISTERED:none  DRAINS: none   LOCAL MEDICATIONS USED:  NONE  SPECIMEN:  No Specimen  DISPOSITION OF SPECIMEN:  N/A  COUNTS:  YES  TOURNIQUET:  * No tourniquets in log *  DICTATION: .Other Dictation: Dictation Number [id number]  PLAN OF CARE: Admit for overnight observation  PATIENT DISPOSITION:  PACU - hemodynamically stable.   Delay start of Pharmacological VTE agent (>24hrs) due to surgical blood loss or risk of bleeding: not applicable

## 2013-12-12 NOTE — H&P (View-Only) (Signed)
Patient ID: Sheri Rasmussen, female   DOB: 01-Apr-1981, 33 y.o.   MRN: [id number]  Chief Complaint  Patient presents with  . Cyst    HPI Sheri Rasmussen is a 33 y.o. female.  The patient is a 33 year old female who is referred by Dr. Murrell Redden for evaluation of the left posterior neck sebaceous cyst. Patient recently had this drained approximately a year ago and has recurred. She states that she has some tenderness in the area however there is limited some serous drainage coming from the area.  HPI  Past Medical History  Diagnosis Date  . Morbid obesity   . SMOKER   . PNEUMONIA 10/2009    a/w ARDS/sepsis  . AMENORRHEA   . Allergy   . DIABETES MELLITUS, TYPE II     No past surgical history on file.  Family History  Problem Relation Age of Onset  . Diabetes Mother     gestational  . Cervical cancer Paternal Grandmother     Social History History  Substance Use Topics  . Smoking status: Current Every Day Smoker -- 10 years    Types: Cigarettes  . Smokeless tobacco: Never Used     Comment: Hmong heritage. married summer 2012. working @ [employer]  . Alcohol Use: Yes     Comment: once a month or less, mixed drinks    No Known Allergies  Current Outpatient Prescriptions  Medication Sig Dispense Refill  . HUMALOG KWIKPEN 100 UNIT/ML injection Take three times a day (35-20-45)      . HYDROcodone-acetaminophen (NORCO) 5-325 MG per tablet Take 1 tablet by mouth every 8 (eight) hours as needed.  20 tablet  0  . insulin detemir (LEVEMIR) 100 UNIT/ML injection Inject 0.8 mL (80 Units total) into the skin at bedtime.  10 mL  3  . Insulin Pen Needle 32G X 4 MM MISC Use as directed three times daily  300 each  1  . metFORMIN (GLUCOPHAGE-XR) 500 MG 24 hr tablet Take 1 by mouth twice a day      . ONE TOUCH ULTRA TEST test strip Use as directed      . ONETOUCH DELICA LANCETS 46E MISC Use as directed      . phentermine 30 MG capsule Take 30 mg by mouth every morning.       No current  facility-administered medications for this visit.    Review of Systems Review of Systems  Constitutional: Negative.   HENT: Negative.   Respiratory: Negative.   Cardiovascular: Negative.   Gastrointestinal: Negative.   Neurological: Negative.   All other systems reviewed and are negative.   Blood pressure 128/86, pulse 80, temperature 98.6 F (37 C), temperature source Temporal, resp. rate 16, height 5\' 3"  (1.6 m), weight 258 lb 3.2 oz (117.119 kg), last menstrual period 10/10/2013.  Physical Exam Physical Exam  Constitutional: She is oriented to person, place, and time. She appears well-developed and well-nourished.  HENT:  Head: Normocephalic and atraumatic.  Eyes: Conjunctivae and EOM are normal. Pupils are equal, round, and reactive to light.  Neck: Normal range of motion. Neck supple.    Cardiovascular: Normal rate, regular rhythm and normal heart sounds.   Pulmonary/Chest: Effort normal and breath sounds normal.  Abdominal: Soft. Bowel sounds are normal.  Musculoskeletal: Normal range of motion.  Neurological: She is alert and oriented to person, place, and time.  Skin: Skin is warm and dry.  Psychiatric: She has a normal mood and affect.    Data Reviewed none  Assessment    33 year old female with a posterior neck sebaceous cyst     Plan    1. We'll proceed to the operating room for excision of the posterior neck sebaceous cyst. 2. I discussed with the patient risks and benefits of the procedure to include but not limited to infection, bleeding, damage to structures, and possible recurrence. Patient voiced understanding and wishes to proceed.  Preoperative the patient prescription for Bactrim to avoid any further soft tissue infection       Ralene Ok 11/14/2013, 3:50 PM

## 2013-12-12 NOTE — Transfer of Care (Signed)
Immediate Anesthesia Transfer of Care Note  Patient: Sheri Rasmussen  Procedure(s) Performed: Procedure(s): LARYNGOSCOPY (Left)  Patient Location: ICU  Anesthesia Type:General  Level of Consciousness: sedated and Patient remains intubated per anesthesia plan  Airway & Oxygen Therapy: Patient remains intubated per anesthesia plan  Post-op Assessment: Post -op Vital signs reviewed and stable  Post vital signs: Reviewed  Complications: unexpected post-op hospitalization and respiratory complications

## 2013-12-12 NOTE — H&P (Signed)
PULMONARY / CRITICAL CARE MEDICINE   Name: Sheri Rasmussen MRN: [id number] DOB: 03-19-1981    ADMISSION DATE:  12/12/2013  REFERRING MD :  Rosendo Gros PRIMARY SERVICE: PCCM  CHIEF COMPLAINT:  Pharyngeal tear during intubation.  BRIEF PATIENT DESCRIPTION: 33 yo presented for removal of left posterior neck sebaceous cyst but suffered a pharyngeal laceration and significant hemorrhage / blood aspiration during intubation attempt. Evaluated by ENT (Dr. Benjamine Mola), no surgical intervention recommended. Brought to ICU intubated.  SIGNIFICANT EVENTS / STUDIES:  6/29  Pharyngeal laceration, blood aspiration after intubation attempt for elective procedure 6/29  Evaluated by ENT (Dr.Teoh) >>> no intervention necessary  LINES / TUBES: OETT 6/29 >>> R IJ CVL 6/29 >>>  CULTURES:  ANTIBIOTICS:  The patient is encephalopathic and unable to provide history, which was obtained for available medical records.  HISTORY OF PRESENT ILLNESS:  33 yo presented for removal of left posterior neck sebaceous cyst but suffered a pharyngeal laceration and significant hemorrhage / blood aspiration during intubation attempt. Evaluated by ENT (Dr. Benjamine Mola), no surgical intervention recommended. Brought to ICU intubated.  PAST MEDICAL HISTORY :  Past Medical History  Diagnosis Date  . Morbid obesity   . SMOKER   . PNEUMONIA 10/2009    a/w ARDS/sepsis  . AMENORRHEA   . Allergy   . DIABETES MELLITUS, TYPE II    Past Surgical History  Procedure Laterality Date  . Dilation and curettage of uterus      2009   Prior to Admission medications   Medication Sig Start Date End Date Taking? Authorizing Provider  HUMALOG KWIKPEN 100 UNIT/ML injection Take three times a day (35-20-45) 07/26/12  Yes Historical Provider, MD  HYDROcodone-acetaminophen (NORCO) 5-325 MG per tablet Take 1 tablet by mouth every 8 (eight) hours as needed. 11/01/13  Yes Heather M Marte, PA-C  insulin detemir (LEVEMIR) 100 UNIT/ML injection Inject 0.8 mL (80 Units  total) into the skin at bedtime. 01/28/13  Yes Rowe Clack, MD  metFORMIN (GLUCOPHAGE-XR) 500 MG 24 hr tablet Take 1 by mouth twice a day 07/09/12  Yes Historical Provider, MD  Multiple Vitamin (MULTIVITAMIN) tablet Take 1 tablet by mouth daily.   Yes Historical Provider, MD  phentermine 30 MG capsule Take 37.5 mg by mouth every morning.    Yes Historical Provider, MD  Insulin Pen Needle 32G X 4 MM MISC Use as directed three times daily 12/15/12   Renato Shin, MD  ONE TOUCH ULTRA TEST test strip Use as directed 07/09/12   Historical Provider, MD  Jonetta Speak LANCETS 40J MISC Use as directed 07/09/12   Historical Provider, MD   No Known Allergies  FAMILY HISTORY:  Family History  Problem Relation Age of Onset  . Diabetes Mother     gestational  . Cervical cancer Paternal Grandmother    SOCIAL HISTORY:  reports that she has been smoking Cigarettes.  She has been smoking about 0.00 packs per day for the past 10 years. She has never used smokeless tobacco. She reports that she drinks alcohol. She reports that she does not use illicit drugs.  REVIEW OF SYSTEMS:  Unable to provide  INTERVAL HISTORY:   VITAL SIGNS: Temp:  [97.9 F (36.6 C)] 97.9 F (36.6 C) (06/29 1058) Pulse Rate:  [87] 87 (06/29 1058) Resp:  [22] 22 (06/29 1058) BP: (104)/(72) 104/72 mmHg (06/29 1058) SpO2:  [97 %] 97 % (06/29 1058) FiO2 (%):  [40 %] 40 % (06/29 1331) Weight:  [253 lb (114.76 kg)] 253 lb (114.76  kg) (06/29 1058)  HEMODYNAMICS:   VENTILATOR SETTINGS: Vent Mode:  [-] PRVC FiO2 (%):  [40 %] 40 % Set Rate:  [14 bmp] 14 bmp Vt Set:  [420 mL] 420 mL PEEP:  [5 cmH20] 5 cmH20 Plateau Pressure:  [21 cmH20] 21 cmH20  INTAKE / OUTPUT: Intake/Output     06/28 0701 - 06/29 0700 06/29 0701 - 06/30 0700   I.V. (mL/kg)  1000 (8.7)   Total Intake(mL/kg)  1000 (8.7)   Net   +1000          PHYSICAL EXAMINATION: General:  Mechanically ventilated, synchronous Neuro:  Encephalopathic, nonfocal,  cough / gag diminished HEENT:  PERRL, OETT Cardiovascular:  RRR, no m/r/g Lungs:  Bilateral air entry, bilateral rhonchi Abdomen:  Soft, nontender, bowel sounds diminished Musculoskeletal:  Moves all extremities, no edema Skin:  Intact  LABS:  CBC  Recent Labs Lab 12/12/13 1129  HGB 15.2*   Coag's No results found for this basename: APTT, INR,  in the last 168 hours  BMET  Recent Labs Lab 12/07/13 0830  NA 140  K 4.0  CL 102  CO2 23  BUN 16  CREATININE 0.60  GLUCOSE 127*   Electrolytes  Recent Labs Lab 12/07/13 0830  CALCIUM 9.2   Sepsis Markers No results found for this basename: LATICACIDVEN, PROCALCITON, O2SATVEN,  in the last 168 hours ABG No results found for this basename: PHART, PCO2ART, PO2ART,  in the last 168 hours Liver Enzymes No results found for this basename: AST, ALT, ALKPHOS, BILITOT, ALBUMIN,  in the last 168 hours Cardiac Enzymes No results found for this basename: TROPONINI, PROBNP,  in the last 168 hours Glucose  Recent Labs Lab 12/12/13 1126  GLUCAP 155*   IMAGING:  No results found.  ASSESSMENT / PLAN:  PULMONARY A: Pharyngeal laceration Acute respiratory failure Probable blood aspiration Tobacco use disorder P:   Goal pH>7.30, SpO2>92 Continuous mechanical support VAP bundle Daily SBT Trend ABG/CXR Albuterol PRN  CARDIOVASCULAR A:  Hemodynamically stable No arrhythmia / ischemia P:  No intervention required  RENAL A:  No active issues P:   Trend BMP NS@75   GASTROINTESTINAL A: GIPx Nutrition P:   NPO FT if intubated > 24 hours Protonix  HEMATOLOGIC A: No acute issues P:  Anemia of acute blood loss, mild  INFECTIOUS A:   No acute issue P:   No intervention required  ENDOCRINE A:   DM   P:   SSI Hold Metformin  NEUROLOGIC A:   Pain  Sedation P:   RASS goal 0 to -1 Fentanyl gtt Fentanyl / Versed PRN  Richardson Landry Minor ACNP Maryanna Shape PCCM Pager [phone] till 3 pm If no answer  page [phone] 12/12/2013, 2:00 PM  I have personally obtained history, examined patient, evaluated and interpreted laboratory and imaging results, reviewed medical records, formulated assessment / plan and placed orders.  CRITICAL CARE:  The patient is critically ill with multiple organ systems failure and requires high complexity decision making for assessment and support, frequent evaluation and titration of therapies, application of advanced monitoring technologies and extensive interpretation of multiple databases. Critical Care Time devoted to patient care services described in this note is 40 minutes.   Doree Fudge, MD Pulmonary and [place] Pager: [phone]  12/12/2013, 2:39 PM

## 2013-12-12 NOTE — Anesthesia Preprocedure Evaluation (Addendum)
Anesthesia Evaluation  Patient identified by MRN, date of birth, ID band Patient awake    Reviewed: Allergy & Precautions, H&P , NPO status , Patient's Chart, lab work & pertinent test results  History of Anesthesia Complications Negative for: history of anesthetic complications  Airway Mallampati: II  Neck ROM: Full    Dental  (+) Teeth Intact   Pulmonary shortness of breath, Current Smoker,  + rhonchi         Cardiovascular Rhythm:Regular Rate:Normal     Neuro/Psych    GI/Hepatic   Endo/Other  diabetesMorbid obesity  Renal/GU      Musculoskeletal   Abdominal (+) + obese,   Peds  Hematology   Anesthesia Other Findings   Reproductive/Obstetrics                          Anesthesia Physical Anesthesia Plan  ASA: III  Anesthesia Plan: General   Post-op Pain Management:    Induction: Intravenous  Airway Management Planned: LMA and Oral ETT  Additional Equipment:   Intra-op Plan:   Post-operative Plan: Extubation in OR  Informed Consent: I have reviewed the patients History and Physical, chart, labs and discussed the procedure including the risks, benefits and alternatives for the proposed anesthesia with the patient or authorized representative who has indicated his/Jamar understanding and acceptance.   Dental advisory given  Plan Discussed with: CRNA and Surgeon  Anesthesia Plan Comments:         Anesthesia Quick Evaluation

## 2013-12-12 NOTE — Procedures (Signed)
Supervised.  Present through the entire procedure.  Doree Fudge, MD Pulmonary and [place] Pager: [phone]

## 2013-12-12 NOTE — OR Nursing (Signed)
Laceration to pharyx during intubation.  Dr Rosendo Gros procedure cancelled.  Dr Benjamine Mola did a laryngoscopy.  Patient transferred to MICU.

## 2013-12-13 ENCOUNTER — Other Ambulatory Visit: Payer: Self-pay | Admitting: Physician Assistant

## 2013-12-13 ENCOUNTER — Encounter (HOSPITAL_BASED_OUTPATIENT_CLINIC_OR_DEPARTMENT_OTHER): Payer: Self-pay | Admitting: Otolaryngology

## 2013-12-13 ENCOUNTER — Inpatient Hospital Stay (HOSPITAL_COMMUNITY): Payer: BC Managed Care – PPO

## 2013-12-13 DIAGNOSIS — J9600 Acute respiratory failure, unspecified whether with hypoxia or hypercapnia: Secondary | ICD-10-CM | POA: Diagnosis present

## 2013-12-13 DIAGNOSIS — J9620 Acute and chronic respiratory failure, unspecified whether with hypoxia or hypercapnia: Secondary | ICD-10-CM

## 2013-12-13 LAB — BLOOD GAS, ARTERIAL
Acid-Base Excess: 1.3 mmol/L (ref 0.0–2.0)
Bicarbonate: 25.9 mEq/L — ABNORMAL HIGH (ref 20.0–24.0)
Drawn by: 24513
FIO2: 40 %
LHR: 14 {breaths}/min
MECHVT: 420 mL
O2 SAT: 99.9 %
PATIENT TEMPERATURE: 98.6
PEEP/CPAP: 5 cmH2O
TCO2: 27.3 mmol/L (ref 0–100)
pCO2 arterial: 45.1 mmHg — ABNORMAL HIGH (ref 35.0–45.0)
pH, Arterial: 7.378 (ref 7.350–7.450)
pO2, Arterial: 156 mmHg — ABNORMAL HIGH (ref 80.0–100.0)

## 2013-12-13 LAB — BASIC METABOLIC PANEL
BUN: 11 mg/dL (ref 6–23)
CALCIUM: 8.4 mg/dL (ref 8.4–10.5)
CO2: 25 meq/L (ref 19–32)
Chloride: 101 mEq/L (ref 96–112)
Creatinine, Ser: 0.57 mg/dL (ref 0.50–1.10)
GFR calc Af Amer: >90 mL/min (ref >90)
GFR calc non Af Amer: >90 mL/min (ref >90)
GLUCOSE: 142 mg/dL — AB (ref 70–99)
Potassium: 3.8 mEq/L (ref 3.7–5.3)
SODIUM: 140 meq/L (ref 137–147)

## 2013-12-13 LAB — CBC
HCT: 37.8 % (ref 36.0–46.0)
HEMOGLOBIN: 12.7 g/dL (ref 12.0–15.0)
MCH: 30.8 pg (ref 26.0–34.0)
MCHC: 33.6 g/dL (ref 30.0–36.0)
MCV: 91.7 fL (ref 78.0–100.0)
Platelets: 221 10*3/uL (ref 150–400)
RBC: 4.12 MIL/uL (ref 3.87–5.11)
RDW: 13.4 % (ref 11.5–15.5)
WBC: 14.1 10*3/uL — ABNORMAL HIGH (ref 4.0–10.5)

## 2013-12-13 LAB — URINE CULTURE
Colony Count: NO GROWTH
Culture: NO GROWTH
SPECIAL REQUESTS: NORMAL

## 2013-12-13 LAB — LEGIONELLA ANTIGEN, URINE: LEGIONELLA ANTIGEN, URINE: NEGATIVE

## 2013-12-13 LAB — GLUCOSE, CAPILLARY
GLUCOSE-CAPILLARY: 138 mg/dL — AB (ref 70–99)
GLUCOSE-CAPILLARY: 145 mg/dL — AB (ref 70–99)
GLUCOSE-CAPILLARY: 179 mg/dL — AB (ref 70–99)
Glucose-Capillary: 140 mg/dL — ABNORMAL HIGH (ref 70–99)

## 2013-12-13 NOTE — Progress Notes (Signed)
223mL Fentanyl wasted in the sink. Ricki Rodriguez, RN witnessed.

## 2013-12-13 NOTE — Evaluation (Signed)
Clinical/Bedside Swallow Evaluation Patient Details  Name: Sheri Rasmussen MRN: [id number] Date of Birth: 03/07/1981  Today's Date: 12/13/2013 Time: [id number] SLP Time Calculation (min): 14 min  Past Medical History:  Past Medical History  Diagnosis Date  . Morbid obesity   . SMOKER   . PNEUMONIA 10/2009    a/w ARDS/sepsis  . AMENORRHEA   . Allergy   . DIABETES MELLITUS, TYPE II    Past Surgical History:  Past Surgical History  Procedure Laterality Date  . Dilation and curettage of uterus      2009   HPI:  The patient is a 33 year old female with a history of a left neck sebaceous cyst. She was scheduled to undergo excision of [place] sebaceous cyst by Dr. Rosendo Rasmussen. It should be noted that the patient has a large body habitus. As a result, [name] intubation was technically difficult. During the intubation process, a right pharyngeal tear was noted by the anesthesiologist. As a result, ENT was consulted for further evaluation of [name] trauma. ENT completed direct laryngoscopy, reported A 1-cm full-thickness mucosal tear was noted anterior to the right tonsillar fossa. A small amount of blood clots were noted. The blood clots were suctioned. No active arterial bleeding was noted. The rest of the pharyngeal mucosa was noted to be intact. The epiglottis, aryepiglottic folds, piriform sinuses, and the vocal cords were all noted to be normal. Pt was intubated from 6/29-6/30.   Assessment / Plan / Recommendation Clinical Impression  Pt demonstrates no immediate or delayed evidence of aspiration. Given brief period of intubation, very mild hoarse voice and superior location of tear with otherwise normal vocal folds per ENT, would not anticipate significant impact on swallow function, other than mild discomfort with solid textures. Pt did indicate pain on the inside of Sheri Rasmussen throat on the right. Recommend pt initiate a full liquid diet, avoiding citrus/acidic drinks and soups. Pt could upgrade to soft solids when she  feels ready, if she discharges today. If she is still admitted tomorrow, SLP will f/u for trials of dry solids and diet upgrade. Provided education to pat and family of s/s of aspiration and they verbalized understanding of diet recs.     Aspiration Risk  Mild    Diet Recommendation Thin liquid   Liquid Administration via: Cup;Straw Medication Administration: Whole meds with liquid Supervision: Patient able to self feed    Other  Recommendations Oral Care Recommendations: Oral care BID   Follow Up Recommendations  None    Frequency and Duration min 1 x/week  1 week   Pertinent Vitals/Pain NA    SLP Swallow Goals     Swallow Study Prior Functional Status       General HPI: The patient is a 33 year old female with a history of a left neck sebaceous cyst. She was scheduled to undergo excision of Sheri Rasmussen sebaceous cyst by Dr. Rosendo Rasmussen. It should be noted that the patient has a large body habitus. As a result, [name] intubation was technically difficult. During the intubation process, a right pharyngeal tear was noted by the anesthesiologist. As a result, ENT was consulted for further evaluation of Sheri Rasmussen trauma. ENT completed direct laryngoscopy, reported A 1-cm full-thickness mucosal tear was noted anterior to the right tonsillar fossa. A small amount of blood clots were noted. The blood clots were suctioned. No active arterial bleeding was noted. The rest of the pharyngeal mucosa was noted to be intact. The epiglottis, aryepiglottic folds, piriform sinuses, and the vocal cords were all noted to be  normal. Pt was intubated from 6/29-6/30. Type of Study: Bedside swallow evaluation Previous Swallow Assessment: none Diet Prior to this Study: NPO Temperature Spikes Noted: No Respiratory Status: Room air History of Recent Intubation: Yes Length of Intubations (days): 1 days Date extubated: 12/13/13 Behavior/Cognition: Cooperative;Alert;Pleasant mood Oral Cavity - Dentition: Adequate natural  dentition Self-Feeding Abilities: Able to feed self Patient Positioning: Upright in bed Baseline Vocal Quality: Hoarse (very mildly hoarse) Volitional Cough: Strong Volitional Swallow: Able to elicit    Oral/Motor/Sensory Function Overall Oral Motor/Sensory Function: Appears within functional limits for tasks assessed   Ice Chips Ice chips: Within functional limits   Thin Liquid Thin Liquid: Within functional limits Presentation: Cup;Straw;Self Fed    Nectar Thick Nectar Thick Liquid: Not tested   Honey Thick Honey Thick Liquid: Not tested   Puree Puree: Within functional limits   Solid   GO    Solid: Not tested      Sheri [place], MA CCC-SLP [phone]  Sheri Rasmussen, Sheri Rasmussen 12/13/2013,10:00 AM

## 2013-12-13 NOTE — Anesthesia Postprocedure Evaluation (Signed)
  Anesthesia Post-op Note  Patient: Sheri Rasmussen  Procedure(s) Performed: Procedure(s): LARYNGOSCOPY (Left)  Patient Location: SICU  Anesthesia Type:General  Level of Consciousness: awake and alert   Airway and Oxygen Therapy: Patient Spontanous Breathing  Post-op Pain: mild  Post-op Assessment: Post-op Vital signs reviewed and Respiratory Function Stable  Post-op Vital Signs: stable  Last Vitals:  Filed Vitals:   12/13/13 0924  BP:   Pulse:   Temp: 37.1 C  Resp:     Complications: Patient re-intubated, soft tissue trauma and oropharnyx trauma during initial intubation , ass c bleeding, now extuabted and doind well. Likely will go home today

## 2013-12-13 NOTE — H&P (Addendum)
PULMONARY / CRITICAL CARE MEDICINE   Name: Sheri Rasmussen MRN: [id number] DOB: 1981-04-09    ADMISSION DATE:  12/12/2013  REFERRING MD :  Rosendo Gros PRIMARY SERVICE: PCCM  CHIEF COMPLAINT:  Pharyngeal tear during intubation.  BRIEF PATIENT DESCRIPTION: 33 yo presented for removal of left posterior neck sebaceous cyst but suffered a pharyngeal laceration and significant hemorrhage / blood aspiration during intubation attempt. Evaluated by ENT (Dr. Benjamine Mola), no surgical intervention recommended. Brought to ICU intubated.  SIGNIFICANT EVENTS / STUDIES:  6/29  Pharyngeal laceration, blood aspiration after intubation attempt for elective procedure 6/29  Evaluated by ENT (Dr.Teoh) >>> no intervention necessary  LINES / TUBES: OETT 6/29 >>> R IJ CVL 6/29 >>>  CULTURES:  ANTIBIOTICS:  INTERVAL HISTORY: texting with ett in mouth  VITAL SIGNS: Temp:  [97.9 F (36.6 C)-99 F (37.2 C)] 99 F (37.2 C) (06/30 0442) Pulse Rate:  [71-92] 84 (06/30 0830) Resp:  [12-22] 17 (06/30 0830) BP: (94-123)/(52-79) 94/56 mmHg (06/30 0830) SpO2:  [97 %-100 %] 99 % (06/30 0830) FiO2 (%):  [40 %] 40 % (06/30 0830) Weight:  [112.8 kg (248 lb 10.9 oz)-114.76 kg (253 lb)] 112.8 kg (248 lb 10.9 oz) (06/30 0442)  HEMODYNAMICS:   VENTILATOR SETTINGS: Vent Mode:  [-] PSV FiO2 (%):  [40 %] 40 % Set Rate:  [14 bmp] 14 bmp Vt Set:  [420 mL] 420 mL PEEP:  [5 cmH20] 5 cmH20 Pressure Support:  [5 cmH20] 5 cmH20 Plateau Pressure:  [11 cmH20-21 cmH20] 16 cmH20  INTAKE / OUTPUT: Intake/Output     06/29 0701 - 06/30 0700 06/30 0701 - 07/01 0700   I.V. (mL/kg) 2638 (23.4) 13 (0.1)   Total Intake(mL/kg) 2638 (23.4) 13 (0.1)   Urine (mL/kg/hr) 1300 130 (0.6)   Total Output 1300 130   Net +1338 -117          PHYSICAL EXAMINATION: General:  Mechanically ventilated, synchronous Neuro:  Alert, nonfocal, appropriate HEENT:  PERRL, OETT Cardiovascular:  RRR, no m/r/g Lungs:  coarse Abdomen:  Soft, nontender, bowel  sounds diminished Musculoskeletal:  Moves all extremities, no edema Skin:  Intact  LABS:  CBC  Recent Labs Lab 12/12/13 1129 12/12/13 1413 12/13/13 0438  WBC  --  14.5* 14.1*  HGB 15.2* 13.3 12.7  HCT  --  39.3 37.8  PLT  --  214 221   Coag's  Recent Labs Lab 12/12/13 1413  APTT 27  INR 1.00    BMET  Recent Labs Lab 12/07/13 0830 12/12/13 1413 12/13/13 0438  NA 140 136* 140  K 4.0 4.5 3.8  CL 102 99 101  CO2 23 25 25   BUN 16 9 11   CREATININE 0.60 0.59 0.57  GLUCOSE 127* 208* 142*   Electrolytes  Recent Labs Lab 12/07/13 0830 12/12/13 1413 12/13/13 0438  CALCIUM 9.2 8.8 8.4  MG  --  1.5  --   PHOS  --  4.3  --    Sepsis Markers No results found for this basename: LATICACIDVEN, PROCALCITON, O2SATVEN,  in the last 168 hours ABG  Recent Labs Lab 12/12/13 1555 12/13/13 0345  PHART 7.324* 7.378  PCO2ART 47.2* 45.1*  PO2ART 97.0 156.0*   Liver Enzymes  Recent Labs Lab 12/12/13 1413  AST 20  ALT 30  ALKPHOS 76  BILITOT 0.3  ALBUMIN 3.7   Cardiac Enzymes No results found for this basename: TROPONINI, PROBNP,  in the last 168 hours Glucose  Recent Labs Lab 12/12/13 1126 12/12/13 1651 12/12/13 1757 12/12/13 2001 12/12/13  2343 12/13/13 0407  GLUCAP 155* 221* 222* 203* 179* 140*   IMAGING:  Dg Chest Port 1 View  12/12/2013   CLINICAL DATA:  Acute respiratory failure. On ventilator.  EXAM: PORTABLE CHEST - 1 VIEW  COMPARISON:  11/20/2009  FINDINGS: Endotracheal tube tip is approximately 5 mm above the carina. Low lung volumes are noted, however there is no evidence of pulmonary consolidation or pleural effusion.  A right internal jugular central venous catheter is seen with tip overlying the right atrium. No pneumothorax identified.  IMPRESSION: Endotracheal tube tip approximately 5 mm above carina. Right internal jugular central venous catheter tip overlies the right atrium.  Low lung volumes.  No evidence of pneumothorax.    Electronically Signed   By: Earle Gell M.D.   On: 12/12/2013 14:46    ASSESSMENT / PLAN:  PULMONARY A: Pharyngeal laceration Acute respiratory failure Probable blood aspiration Tobacco use disorder P:   abg reviewed last, keep same mV wean sbt cpap 5 ps 5, goal 30 min, assess rsbi Assess leak test Albuterol PRN If not extubated re evaluate ett placement  CARDIOVASCULAR A:  Hemodynamically stable No arrhythmia / ischemia P:  No intervention required tele  RENAL A:  No active issues P:   Trend BMP NS@75  until swallowing on own  GASTROINTESTINAL A: GIPx Nutrition High risk dysphagia P:   NPO Protonix Will need formal swallow testing  HEMATOLOGIC A: Blood loss from intubation tear P:  Anemia of acute blood loss, mild scd  INFECTIOUS A:   No acute issue P:   No intervention required  ENDOCRINE A:   DM   P:   SSI Hold Metformin  NEUROLOGIC A:   Pain  Sedation P:   RASS goal 0 Fentanyl gtt Fentanyl / Versed PRN - WUA active I have personally obtained history, examined patient, evaluated and interpreted laboratory and imaging results, reviewed medical records, formulated assessment / plan and placed orders.  CRITICAL CARE:  The patient is critically ill with multiple organ systems failure and requires high complexity decision making for assessment and support, frequent evaluation and titration of therapies, application of advanced monitoring technologies and extensive interpretation of multiple databases. Critical Care Time devoted to patient care services described in this note is 30 minutes.   Raylene Miyamoto., MD Pulmonary and [place] Pager: [phone]  12/13/2013, 8:52 AM

## 2013-12-13 NOTE — Progress Notes (Signed)
Late Entry  Pt had Sabine operation cancelled secondary to anesthesia issues as outlined per their notes. Updated husband. Transferred to [facility] ICU for pulm monitoring.

## 2013-12-13 NOTE — Discharge Summary (Signed)
Physician Discharge Summary  Patient ID: Sheri Rasmussen MRN: [id number] DOB/AGE: 1981-03-09 33 y.o.  Admit date: 12/12/2013 Discharge date: 12/13/2013    Discharge Diagnoses:  S/p pharyngeal laceration during intubation attempt for elective outpatient procedure Acute respiratory failure Probable blood aspiration Tobacco use DM                                                                      DISCHARGE PLAN BY DIAGNOSIS     S/p pharyngeal laceration during intubation attempt for elective outpatient procedure Acute respiratory failure Probable blood aspiration Tobacco use Discharge Plan: - Follow up with PCP as scheduled below for repeat CXR. - Follow up with ENT - Dr. Benjamine Mola as scheduled below for further evaluation of pharyngeal laceration. - Tobacco cessation strongly encouraged.  DM Discharge Plan: - Continue home meds.                DISCHARGE SUMMARY   Sheri Rasmussen is a 33 y.o. y/o female with a PMH of obesity, tobacco use, amenorrhea, DM II, brought to [facility] ICU on 6/29 as a transfer from day surgery after she unfortunately suffered a posterior pharyngeal laceration during intubation attempt prior to elective surgery.  She was scheduled for removal of a left neck posterior sebaceous cyst; however, intubation was complicated by pharyngeal laceration with moderate - significant hemorrhage with probable blood aspiration.  She was evaluated by ENT (Dr. Benjamine Mola) at day surgery who did not feel that any surgical repair of laceration / intervention was needed.  She was successfully intubated on second attempt and transferred to the ICU for further monitoring. She remained intubated overnight on minimal sedation and was successfully extubated early AM on 6/30. She was deemed safe for discharge home and has been scheduled for follow up with [place] PCP [name]) as well as ENT Benjamine Mola) as indicated below.           SIGNIFICANT DIAGNOSTIC STUDIES / EVENTS 6/29 Pharyngeal laceration, blood aspiration after  intubation attempt for elective procedure.  Evaluated by ENT (Dr.Teoh) >>> no intervention necessary, intubated on 2nd attempt and transferred to [facility] ICU.  MICRO DATA  None  ANTIBIOTICS None  CONSULTS None  TUBES / LINES OETT 6/29 >>> 6/29 R IJ CVL 6/29 >>> 6/29   Discharge Exam: General: Pleasant female, resting in bed talking with family, in NAD.  Just extubated. Neuro: A&O x 3, non-focal.  HEENT: [place]/AT. PERRL, sclerae anicteric. Cardiovascular: RRR, no M/R/G.  Lungs: Respirations even and unlabored.  CTA bilaterally, No W/R/R. Abdomen: BS x 4, soft, NT/ND.  Musculoskeletal: No gross deformities, no edema.  Skin: Intact, warm, no rashes.     Filed Vitals:   12/13/13 1000 12/13/13 1100 12/13/13 1200 12/13/13 1300  BP: 97/62 91/59 104/62 101/68  Pulse: 73 76 80 81  Temp:    98.9 F (37.2 C)  TempSrc: Oral Oral Oral Oral  Resp: '16 16 14 21  ' Height:      Weight:      SpO2: 98% 98% 98% 95%     Discharge Labs  BMET  Recent Labs Lab 12/07/13 0830 12/12/13 1413 12/13/13 0438  NA 140 136* 140  K 4.0 4.5 3.8  CL 102 99 101  CO2 '23 25 25  ' GLUCOSE 127*  208* 142*  BUN '16 9 11  ' CREATININE 0.60 0.59 0.57  CALCIUM 9.2 8.8 8.4  MG  --  1.5  --   PHOS  --  4.3  --     CBC  Recent Labs Lab 12/12/13 1129 12/12/13 1413 12/13/13 0438  HGB 15.2* 13.3 12.7  HCT  --  39.3 37.8  WBC  --  14.5* 14.1*  PLT  --  214 221    Anti-Coagulation  Recent Labs Lab 12/12/13 1413  INR 1.00    Discharge Instructions   Call MD for:  difficulty breathing, headache or visual disturbances    Complete by:  As directed      Call MD for:  persistant dizziness or light-headedness    Complete by:  As directed      Call MD for:  persistant nausea and vomiting    Complete by:  As directed      Call MD for:  severe uncontrolled pain    Complete by:  As directed      Call MD for:  temperature >100.4    Complete by:  As directed      Check Pulse Oximetry while ambulating     Complete by:  As directed      Diet - low sodium heart healthy    Complete by:  As directed      Increase activity slowly    Complete by:  As directed                 Follow-up Information   Follow up with [name], MD On 12/15/2013. (Your appointment is at 9:00AM)    Specialty:  Internal Medicine   Contact information:   [address] [place] [address] [phone]       Follow up with [name], MD On 12/21/2013. (Your appointment is at 1:20PM)    Specialty:  Otolaryngology   Contact information:   [address] [place] [address] [phone]          Medication List         HUMALOG KWIKPEN 100 UNIT/ML KiwkPen  Generic drug:  insulin lispro  Take three times a day (35-20-45)     HYDROcodone-acetaminophen 5-325 MG per tablet  Commonly known as:  NORCO  Take 1 tablet by mouth every 8 (eight) hours as needed.     insulin detemir 100 UNIT/ML injection  Commonly known as:  LEVEMIR  Inject 0.8 mL (80 Units total) into the skin at bedtime.     Insulin Pen Needle 32G X 4 MM Misc  Use as directed three times daily     metFORMIN 500 MG 24 hr tablet  Commonly known as:  GLUCOPHAGE-XR  Take 1 by mouth twice a day     multivitamin tablet  Take 1 tablet by mouth daily.     ONE TOUCH ULTRA TEST test strip  Generic drug:  glucose blood  Use as directed     ONETOUCH DELICA LANCETS 68S Misc  Use as directed     phentermine 30 MG capsule  Take 37.5 mg by mouth every morning.          Disposition: Home.  Discharged Condition: [name] has met maximum benefit of inpatient care and is medically stable and cleared for discharge.  Patient is pending follow up as above.      Time spent on disposition:  Greater than 45 minutes.  Montey Hora, [place] [facility] Pgr: [phone]  or [phone]   Agree with note For gen surg and ent follow up [facility] ICU, no desat Will  see pcp in 48 hr, pcxr  Lavon Paganini. Titus Mould, MD, [place] Pgr: [place] [facility]

## 2013-12-13 NOTE — Procedures (Signed)
Extubation Procedure Note  Patient Details:   Name: Sheri Rasmussen DOB: 1980/08/12 MRN: [id number]   Airway Documentation:  Airway 6.5 mm (Active)  Secured at (cm) 23 cm 12/13/2013  8:30 AM  Measured From Lips 12/13/2013  8:30 AM  Secured Location Right 12/13/2013  8:30 AM  Secured By [employer] 12/13/2013  8:30 AM  Tube Holder Repositioned Yes 12/13/2013  8:30 AM  Cuff Pressure (cm H2O) 26 cm H2O 12/13/2013  5:22 AM  Site Condition Dry 12/12/2013 11:55 PM    Evaluation  O2 sats: stable throughout Complications: No apparent complications Patient did tolerate procedure well. Bilateral Breath Sounds: Clear;Diminished Suctioning: Airway Yes: knows and able to say name and aware of surroundings  April Holding 12/13/2013, 9:15 AM

## 2013-12-13 NOTE — Op Note (Signed)
NAME:  Rasmussen, Sheri                     ACCOUNT NO.:  [id number]  MEDICAL RECORD NO.:  [id number]  LOCATION:  2M07C                        FACILITY:  [facility]  PHYSICIAN:  Leta Baptist, MD            DATE OF BIRTH:  02-Apr-1981  DATE OF PROCEDURE:  12/12/2013 DATE OF DISCHARGE:                              OPERATIVE REPORT   SURGEON:  Leta Baptist, MD  PREOPERATIVE DIAGNOSIS: 1. Pharyngeal tear during intubation. 2. History of left neck sebaceous cyst.  POSTOPERATIVE DIAGNOSES: 1. Pharyngeal tear during intubation. 2. History of left neck sebaceous cyst.  PROCEDURE PERFORMED:  Direct laryngoscopy.  ANESTHESIA:  General endotracheal tube anesthesia.  COMPLICATIONS:  None.  ESTIMATED BLOOD LOSS:  Minimal.  INDICATION FOR PROCEDURE:  The patient is a 33 year old female with a history of a left neck sebaceous cyst.  She was scheduled to undergo excision of Boerner sebaceous cyst by Dr. Rosendo Gros.  It should be noted that the patient has a large body habitus.  As a result, Hemp intubation was technically difficult.  During the intubation process, a right pharyngeal tear was noted by the anesthesiologist.  As a result, ENT was consulted for further evaluation of Heiss trauma.  The patient was identified in the operating room.  The patient was noted to be intubated, under general anesthesia.  DESCRIPTION:  The patient was positioned and prepped and draped in standard fashion for direct laryngoscopy evaluation.  Mouth guard was placed to protect the dentition.  A Dedo laryngoscope was used for The evaluation.  The Dedo laryngoscope was inserted via the oral cavity into the oropharynx.  A 1-cm full-thickness mucosal tear was noted anterior to the right tonsillar fossa.  A small amount of blood clots were noted.  The blood clots were suctioned.  No active arterial bleeding was noted.  The rest of the pharyngeal mucosa was noted to be intact.  The epiglottis, aryepiglottic folds, piriform sinuses, and  the vocal cords were all noted to be normal.  No other trauma or bleeding was noted.  The Dedo laryngoscope was withdrawn.  That concluded procedure for the patient.  OPERATIVE FINDINGS:  A 1-cm pharyngeal mucosal tear was noted adjacent to the right tonsillar fossa.  No other trauma, mass, lesion, or significant edema was noted.  SPECIMEN:  None.  FOLLOWUP CARE:  The patient will be admitted to the [facility] for overnight observation.  The laceration likely will heal without further intervention. No airway issue or obstruction was noted.     Leta Baptist, MD     ST/MEDQ  D:  12/12/2013  T:  12/13/2013  Job:  [id number]

## 2013-12-13 NOTE — Anesthesia Procedure Notes (Signed)
Procedure Name: Intubation Date/Time: 12/12/2013 12:12 PM Performed by: Melynda Ripple D Pre-anesthesia Checklist: Patient identified, Emergency Drugs available, Suction available and Patient being monitored Patient Re-evaluated:Patient Re-evaluated prior to inductionOxygen Delivery Method: Circle System Utilized Preoxygenation: Pre-oxygenation with 100% oxygen Intubation Type: IV induction Ventilation: Mask ventilation without difficulty Laryngoscope Size: Miller and 2 (SEE note) Grade View: Grade II Tube type: Oral Tube size: 6.5 mm Number of attempts: 5 or more Airway Equipment and Method: stylet,  oral airway and Video-laryngoscopy Placement Confirmation: ETT inserted through vocal cords under direct vision,  positive ETCO2 and breath sounds checked- equal and bilateral Secured at: 24 cm Tube secured with: Tape Dental Injury: Bloody posterior oropharynx  Difficulty Due To: Difficulty was anticipated, Difficult Airway- due to large tongue and Difficult Airway-  due to edematous airway Future Recommendations: Recommend- induction with short-acting agent, and alternative techniques readily available

## 2013-12-13 NOTE — Progress Notes (Signed)
  Subjective: Pt with no significant changes overnight.  Weaning off vent  Objective: Vital signs in last 24 hours: Temp:  [97.9 F (36.6 C)-99 F (37.2 C)] 99 F (37.2 C) (06/30 0442) Pulse Rate:  [71-92] 88 (06/30 0600) Resp:  [12-22] 12 (06/30 0600) BP: (103-123)/(52-79) 110/56 mmHg (06/30 0600) SpO2:  [97 %-100 %] 98 % (06/30 0600) FiO2 (%):  [40 %] 40 % (06/30 0522) Weight:  [248 lb 10.9 oz (112.8 kg)-253 lb (114.76 kg)] 248 lb 10.9 oz (112.8 kg) (06/30 0442) Last BM Date:  (PTA)  Intake/Output from previous day: 06/29 0701 - 06/30 0700 In: 2548 [I.V.:2548] Out: 1300 [Urine:1300] Intake/Output this shift:    General appearance: alert and cooperative Resp: clear to auscultation bilaterally Cardio: regular rate and rhythm, S1, S2 normal, no murmur, click, rub or gallop GI: soft, non-tender; bowel sounds normal; no masses,  no organomegaly  Lab Results:   Recent Labs  12/12/13 1413 12/13/13 0438  WBC 14.5* 14.1*  HGB 13.3 12.7  HCT 39.3 37.8  PLT 214 221   BMET  Recent Labs  12/12/13 1413 12/13/13 0438  NA 136* 140  K 4.5 3.8  CL 99 101  CO2 25 25  GLUCOSE 208* 142*  BUN 9 11  CREATININE 0.59 0.57  CALCIUM 8.8 8.4   PT/INR  Recent Labs  12/12/13 1413  LABPROT 13.2  INR 1.00   ABG  Recent Labs  12/12/13 1555 12/13/13 0345  PHART 7.324* 7.378  HCO3 24.6* 25.9*    Studies/Results: Dg Chest Port 1 View  12/12/2013   CLINICAL DATA:  Acute respiratory failure. On ventilator.  EXAM: PORTABLE CHEST - 1 VIEW  COMPARISON:  11/20/2009  FINDINGS: Endotracheal tube tip is approximately 5 mm above the carina. Low lung volumes are noted, however there is no evidence of pulmonary consolidation or pleural effusion.  A right internal jugular central venous catheter is seen with tip overlying the right atrium. No pneumothorax identified.  IMPRESSION: Endotracheal tube tip approximately 5 mm above carina. Right internal jugular central venous catheter tip  overlies the right atrium.  Low lung volumes.  No evidence of pneumothorax.   Electronically Signed   By: Earle Gell M.D.   On: 12/12/2013 14:46    Anti-infectives: Anti-infectives   Start     Dose/Rate Route Frequency Ordered Stop   12/12/13 1104  ceFAZolin (ANCEF) IVPB 2 g/50 mL premix  Status:  Discontinued     2 g 100 mL/hr over 30 Minutes Intravenous On call to O.R. 12/12/13 1104 12/12/13 1316      Assessment/Plan: s/p Procedure(s): LARYNGOSCOPY (Left) Vent weaning as per CCM    LOS: 1 day    Rosario Jacks., Anne Hahn 12/13/2013

## 2013-12-14 ENCOUNTER — Telehealth (INDEPENDENT_AMBULATORY_CARE_PROVIDER_SITE_OTHER): Payer: Self-pay | Admitting: General Surgery

## 2013-12-14 NOTE — Telephone Encounter (Signed)
I called the patient to check on Schank status. Patient states she is to continue with a sore throat.  I discussed with Reisen the events of the day of surgery. I discussed with Leasure in detail the events of the place and the need for ICU care. She did express Galloway anxiety for Cahn rescheduling of surgery, which is understandable. She will call his back when she decides on rescheduling surgery.  All questions were answered to Juniel satisfaction.

## 2013-12-15 ENCOUNTER — Encounter: Payer: Self-pay | Admitting: Internal Medicine

## 2013-12-15 ENCOUNTER — Ambulatory Visit (INDEPENDENT_AMBULATORY_CARE_PROVIDER_SITE_OTHER): Payer: BC Managed Care – PPO | Admitting: Internal Medicine

## 2013-12-15 VITALS — BP 110/78 | HR 81 | Temp 98.3°F | Wt 252.0 lb

## 2013-12-15 DIAGNOSIS — Z5189 Encounter for other specified aftercare: Secondary | ICD-10-CM

## 2013-12-15 DIAGNOSIS — Z9189 Other specified personal risk factors, not elsewhere classified: Secondary | ICD-10-CM

## 2013-12-15 DIAGNOSIS — Z87898 Personal history of other specified conditions: Secondary | ICD-10-CM

## 2013-12-15 DIAGNOSIS — S0993XD Unspecified injury of face, subsequent encounter: Secondary | ICD-10-CM

## 2013-12-15 MED ORDER — HYDROCODONE-HOMATROPINE 5-1.5 MG/5ML PO SYRP: 5.0000 mL | ORAL_SOLUTION | Freq: Four times a day (QID) | ORAL | Status: DC | PRN

## 2013-12-15 NOTE — Patient Instructions (Signed)
The most common cause of elevated triglycerides (TG) is the ingestion of sugar from high fructose corn syrup sources added to processed foods & drinks.  Eat a low-fat diet with lots of fruits and vegetables, up to 7-9 servings per day. Consume less than 30  Grams (preferably ZERO) of sugar per day from foods & drinks with High Fructose Corn Syrup (HFCS) sugar as #1,2,3 or # 4 on label.[employer], [employer] do not carry products with HFCS. Follow a  low carb nutrition program such as [place] or The New Sugar Busters  to prevent Diabetes progression . White carbohydrates (potatoes, rice, bread, and pasta) have a high spike of sugar and a high load of sugar. For example a  baked potato has a cup of sugar and a  french fry  2 teaspoons of sugar. Yams, wild  rice, whole grained bread &  wheat pasta have been much lower spike and load of  sugar. Portions should be the size of a deck of cards or your palm.  The Sleep Medicine referral will be scheduled and you'll be notified of the time.

## 2013-12-15 NOTE — Progress Notes (Signed)
Pre visit review using our clinic review tool, if applicable. No additional management support is needed unless otherwise documented below in the visit note. 

## 2013-12-15 NOTE — Progress Notes (Signed)
   Subjective:    Patient ID: Sheri Rasmussen, female    DOB: [birth year], 33 y.o.   MRN: [id number]  HPI  She sustained an oropharyngeal laceration during elective intubation for operative removal of a left posterior epidermoid inclusion cyst. There was bleeding with the oral pharyngeal trauma with associated respiratory compromise. She was transferred to the ICU 12/12/13 for acute respiratory failure & probable aspiration of blood. She was discharged on 12/13/13.  She continues to have sore throat as well as cough which is nonproductive. This is associated with some inferior bilateral thoracic discomfort. She also describes ear pressure without discharge. This is worse on the left  She had smoked a pack per week for 10 years but has not smoked since discharge.  Since discharge [place] fasting sugars have been 145-150.      Review of Systems  She denies frontal headache, facial pain, dental pain, or nasal discharge.  There is no sputum production or associated shortness of breath or wheezing.  She has no chest pain, palpitations, or edema.  Dyspepsia & reflux symptoms are not significant.  Schlink husband does states that she snores. He has not witnessed any definite apnea.  She is on high-dose insulin for M[place]ollom diabetes.     Objective:   Physical Exam  Significant or distinguishing  findings on physical exam are documented first.  Below that are other systems examined & findings.   Based on CDC criteria she has severe obesity. Mucoid changes of the TMs with decreased light reflex and retraction of the tympanic membranes present. There is decreased range of motion of the cervical spine. The oral pharynx is markedly crowded. The posterior pharynx cannot be visualized. Abdomen is protuberant; there is dullness in RUQ to percussion. Valgus deformities are noted of the lower extremities. There is puckering and scarring of the epidermoid inclusion cyst of left posterior neck.  General appearance  :adequate nourishment w/o distress.  Eyes: No conjunctival inflammation or scleral icterus is present.  Oral exam: Dental hygiene is good; lips and gums are healthy appearing. Heart:  Normal rate and regular rhythm. S1 and S2 normal without gallop, murmur, click, rub or other extra sounds     Lungs:Chest clear to auscultation; no wheezes, rhonchi,rales ,or rubs present.No increased work of breathing.   Abdomen: bowel sounds normal, soft and non-tender without masses, organomegaly or hernias noted.  No guarding or rebound .  Musculoskeletal: Able to lie flat and sit up without help. Negative straight leg raising bilaterally. Gait normal  Skin:Warm & dry.  Intact without suspicious lesions or rashes ; no jaundice or tenting  Lymphatic: No lymphadenopathy is noted about the head, neck, axilla          Assessment & Plan:  #1 oropharyngeal trauma with bleeding  #2 history of snoring  #3 profound compromise of the oropharyngeal diameter with high risk of sleep apnea.  The pathophysiology & long-term risk related to these findings were discussed with Revolorio.  Nutritional interventions were also discussed.

## 2013-12-15 NOTE — Progress Notes (Signed)
   Subjective:    Patient ID: Sheri Rasmussen, female    DOB: 1980-10-09, 33 y.o.   MRN: [id number]  HPI  Patient was discharged from hospital on 12/13/13 for acute respiratory failure after an intubation was unsuccessful and lacerated Mcclaran oropharynx. She was there for an I&D of a sebaceous cyst on Longhi posterior neck. This was not done and after a successful intubation she was transferred to the ICU. Today she was following up. She still has a sore throat and cough. The cough is not worsening, she has been exposed to other sick individuals in the ICU, and she has mild costochondral pain when coughing. Coughing paroxysms last a few seconds and on 2 occassions today she had dime sized blood clots.   She has not tried any medications due to being on a liquid diet for a few days post-discharge. She has tried sipping warm liquids, which is helpful. She also has ear pressure and pruritus without discharge. The left side, she states, is worse than the right.   She has not smoked any cigarettes since 12/13/13. She has a pack/week hx for 10+ years. Zwick fasting blood sugars have been avg. 145-150 since discharge.   Review of Systems  Constitutional: Negative for fever, chills, diaphoresis and fatigue.  HENT: Positive for ear pain and sore throat. Negative for congestion, drooling, ear discharge, facial swelling, hearing loss, nosebleeds, postnasal drip, rhinorrhea, sinus pressure, sneezing, tinnitus, trouble swallowing and voice change.   Eyes: Negative for pain and itching.  Respiratory: Positive for cough. Negative for chest tightness, shortness of breath, wheezing and stridor.   Cardiovascular: Negative for chest pain, palpitations and leg swelling.  Gastrointestinal: Negative for nausea, vomiting, abdominal pain, diarrhea, constipation, blood in stool, abdominal distention and anal bleeding.  Endocrine: Negative for polydipsia, polyphagia and polyuria.  Genitourinary: Negative for dysuria, hematuria, flank pain  and difficulty urinating.  Musculoskeletal: Negative for arthralgias, back pain, gait problem, joint swelling, myalgias, neck pain and neck stiffness.  Skin: Negative for color change, pallor and rash.  Neurological: Negative for dizziness, tremors, seizures, syncope, weakness, light-headedness, numbness and headaches.  Hematological: Negative for adenopathy. Does not bruise/bleed easily.       Objective:   Physical Exam  HENT:  Head: Normocephalic.  Right Ear: Hearing normal.  Left Ear: Hearing normal. No mastoid tenderness.  Nose: No mucosal edema, rhinorrhea or nose lacerations. No epistaxis.  Mouth/Throat: Lacerations present.  Healing laceration in the right upper oropharynx          Assessment & Plan:

## 2013-12-19 ENCOUNTER — Telehealth: Payer: Self-pay | Admitting: Internal Medicine

## 2013-12-19 NOTE — Telephone Encounter (Signed)
Relevant patient education assigned to patient using Emmi. ° °

## 2013-12-21 ENCOUNTER — Telehealth: Payer: Self-pay

## 2013-12-21 NOTE — Telephone Encounter (Signed)
Phone call from patient requesting a referral to Dr Posey Pronto for a second opiniion

## 2013-12-22 ENCOUNTER — Other Ambulatory Visit: Payer: Self-pay | Admitting: Internal Medicine

## 2013-12-22 DIAGNOSIS — E1149 Type 2 diabetes mellitus with other diabetic neurological complication: Secondary | ICD-10-CM

## 2013-12-22 NOTE — Telephone Encounter (Signed)
Phone call back to patient she states a referral to Dr Amalia Greenhouse ([facility]) 2nd opinion regarding Ericson diabetes management.

## 2013-12-22 NOTE — Telephone Encounter (Signed)
Dr Posey Pronto the Neurologist ? I found no neurologic diagnosis in chart.

## 2014-01-25 ENCOUNTER — Ambulatory Visit (INDEPENDENT_AMBULATORY_CARE_PROVIDER_SITE_OTHER): Payer: BC Managed Care – PPO | Admitting: Emergency Medicine

## 2014-01-25 VITALS — BP 126/76 | HR 102 | Temp 98.1°F | Resp 20 | Ht 63.0 in | Wt 257.4 lb

## 2014-01-25 DIAGNOSIS — A499 Bacterial infection, unspecified: Secondary | ICD-10-CM

## 2014-01-25 DIAGNOSIS — N760 Acute vaginitis: Secondary | ICD-10-CM

## 2014-01-25 DIAGNOSIS — B9689 Other specified bacterial agents as the cause of diseases classified elsewhere: Secondary | ICD-10-CM

## 2014-01-25 DIAGNOSIS — J018 Other acute sinusitis: Secondary | ICD-10-CM

## 2014-01-25 LAB — POCT WET PREP WITH KOH
KOH PREP POC: NEGATIVE
Trichomonas, UA: NEGATIVE
Yeast Wet Prep HPF POC: NEGATIVE

## 2014-01-25 MED ORDER — PSEUDOEPHEDRINE-GUAIFENESIN ER 60-600 MG PO TB12: 1.0000 | ORAL_TABLET | Freq: Two times a day (BID) | ORAL | Status: DC

## 2014-01-25 MED ORDER — METRONIDAZOLE 500 MG PO TABS: 500.0000 mg | ORAL_TABLET | Freq: Two times a day (BID) | ORAL | Status: DC

## 2014-01-25 MED ORDER — AMOXICILLIN-POT CLAVULANATE 875-125 MG PO TABS: 1.0000 | ORAL_TABLET | Freq: Two times a day (BID) | ORAL | Status: DC

## 2014-01-25 NOTE — Patient Instructions (Signed)

## 2014-01-25 NOTE — Addendum Note (Signed)
Addended by: Roselee Culver on: 01/25/2014 09:06 PM   Modules accepted: Orders

## 2014-01-25 NOTE — Addendum Note (Signed)
Addended by: Roselee Culver on: 01/25/2014 09:27 PM   Modules accepted: Orders

## 2014-01-25 NOTE — Progress Notes (Addendum)
[facility] [address] [place] [address] [phone]  Date:  01/25/2014   Name:  Sheri Rasmussen   DOB:  Oct 17, 1980   MRN:  [id number]  PCP:  [name], MD    Chief Complaint: Sinusitis   History of Present Illness:  Sheri Rasmussen is a 33 y.o. very pleasant female patient who presents with the following:  Ill with left facial pain, pressure in maxillary sinus.  Post nasal drainage.  No cough or coryza.  History of prior sinusitis.  No nasal drainage.   No fever or chills.  No wheezing or shortness of breath. Has vaginal itchng and scant discharge.  No improvement with otc meds No improvement with over the counter medications or other home remedies. Denies other complaint or health concern today.   Patient Active Problem List   Diagnosis Date Noted  . Severe obesity (BMI >= 40) 12/15/2013  . Acute respiratory failure 12/13/2013  . Respiratory failure 12/12/2013  . AMENORRHEA 07/26/2010  . MORBID OBESITY 12/04/2009  . SMOKER 12/04/2009  . SNORING 12/04/2009  . PNEUMONIA 11/06/2009  . Type II or unspecified type diabetes mellitus with neurological manifestations, uncontrolled(250.62) 10/17/2009    Past Medical History  Diagnosis Date  . Morbid obesity   . SMOKER   . PNEUMONIA 10/2009    a/w ARDS/sepsis  . AMENORRHEA   . Allergy   . DIABETES MELLITUS, TYPE II     Past Surgical History  Procedure Laterality Date  . Dilation and curettage of uterus      2009  . Ear cyst excision Left 12/12/2013    Procedure: LARYNGOSCOPY;  Surgeon: [name], MD;  Location: [place];  Service: ENT;  Laterality: Left;    History  Substance Use Topics  . Smoking status: Former Smoker -- 10 years    Types: Cigarettes    Quit date: 01/14/2014  . Smokeless tobacco: Never Used     Comment: Hmong heritage. married summer 2012. working @ [employer]. Quit 12/12/13  . Alcohol Use: Yes     Comment: once a month or less, mixed drinks    Family  History  Problem Relation Age of Onset  . Diabetes Mother     gestational  . Cervical cancer Paternal Grandmother     No Known Allergies  Medication list has been reviewed and updated.  Current Outpatient Prescriptions on File Prior to Visit  Medication Sig Dispense Refill  . HUMALOG KWIKPEN 100 UNIT/ML injection Take three times a day (35-20-45)      . insulin detemir (LEVEMIR) 100 UNIT/ML injection Inject 0.8 mL (80 Units total) into the skin at bedtime.  10 mL  3  . Insulin Pen Needle 32G X 4 MM MISC Use as directed three times daily  300 each  1  . metFORMIN (GLUCOPHAGE-XR) 500 MG 24 hr tablet Take 1 by mouth twice a day      . Multiple Vitamin (MULTIVITAMIN) tablet Take 1 tablet by mouth daily.      . ONE TOUCH ULTRA TEST test strip Use as directed      . ONETOUCH DELICA LANCETS 44I MISC Use as directed       No current facility-administered medications on file prior to visit.    Review of Systems:  As per HPI, otherwise negative.    Physical Examination: Filed Vitals:   01/25/14 1932  BP: 126/76  Pulse: 102  Temp: 98.1 F (36.7 C)  Resp: 20  Filed Vitals:   01/25/14 1932  Height: 5\' 3"  (1.6 m)  Weight: 257 lb 6.4 oz (116.756 kg)   Body mass index is 45.61 kg/(m^2). Ideal Body Weight: Weight in (lb) to have BMI = 25: 140.8  GEN: WDWN, NAD, Non-toxic, A & O x 3 HEENT: Atraumatic, Normocephalic. Neck supple. No masses, No LAD. Ears and Nose: No external deformity. CV: RRR, No M/G/R. No JVD. No thrill. No extra heart sounds. PULM: CTA B, no wheezes, crackles, rhonchi. No retractions. No resp. distress. No accessory muscle use. ABD: S, NT, ND, +BS. No rebound. No HSM. EXTR: No c/c/e NEURO Normal gait.  PSYCH: Normally interactive. Conversant. Not depressed or anxious appearing.  Calm demeanor.    Assessment and Plan: Sinusitis augmentin mucinex d BV Flagyl   Signed,  Ellison Carwin, MD   Results for orders placed in visit on 01/25/14  POCT WET  PREP WITH KOH      Result Value Ref Range   Trichomonas, UA Negative     Clue Cells Wet Prep HPF POC 1-3     Epithelial Wet Prep HPF POC 2-4     Yeast Wet Prep HPF POC neg     Bacteria Wet Prep HPF POC 2+     RBC Wet Prep HPF POC 0-2     WBC Wet Prep HPF POC 3-6     KOH Prep POC Negative

## 2014-02-07 ENCOUNTER — Encounter: Payer: Self-pay | Admitting: Internal Medicine

## 2014-02-09 LAB — BASIC METABOLIC PANEL
BUN: 12 mg/dL (ref 4–21)
CREATININE: 0.8 mg/dL (ref 0.5–1.1)
GLUCOSE: 241 mg/dL
Potassium: 4.2 mmol/L (ref 3.4–5.3)
Sodium: 137 mmol/L (ref 137–147)

## 2014-02-09 LAB — HEPATIC FUNCTION PANEL
ALT: 35 U/L (ref 7–35)
AST: 40 U/L — AB (ref 13–35)
Bilirubin, Total: 0.3 mg/dL

## 2014-02-09 LAB — HEMOGLOBIN A1C: Hgb A1c MFr Bld: 7.5 % — AB (ref 4.0–6.0)

## 2014-02-09 LAB — TSH: TSH: 2.33 u[IU]/mL (ref <=5.90)

## 2014-02-09 LAB — GLUCOSE, POCT (MANUAL RESULT ENTRY): POC GLUCOSE: 244 mg/dL — AB (ref 70–99)

## 2014-02-15 ENCOUNTER — Ambulatory Visit (INDEPENDENT_AMBULATORY_CARE_PROVIDER_SITE_OTHER): Payer: BC Managed Care – PPO | Admitting: Pulmonary Disease

## 2014-02-15 ENCOUNTER — Encounter: Payer: Self-pay | Admitting: Pulmonary Disease

## 2014-02-15 VITALS — BP 118/70 | HR 91 | Temp 98.0°F | Ht 63.0 in | Wt 256.6 lb

## 2014-02-15 DIAGNOSIS — R0989 Other specified symptoms and signs involving the circulatory and respiratory systems: Principal | ICD-10-CM

## 2014-02-15 DIAGNOSIS — R0609 Other forms of dyspnea: Secondary | ICD-10-CM

## 2014-02-15 NOTE — Progress Notes (Signed)
Subjective:    Patient ID: Sheri Rasmussen, female    DOB: Oct 15, 1980, 33 y.o.   MRN: [id number]  HPI The patient is a 33 year old female who I've been asked to see for possible obstructive sleep apnea. She was recently in the hospital for a minor surgical procedure that required general anesthesia, and was an extremely difficult intubation with trauma to the airway. She required a short ICU stay, but has suffered no sequelae. The question was raised whether or not she may have obstructive sleep apnea given [name] difficult airway. She has been noted to have mild intermittent snoring by [name] bed partner, but has never commented on an abnormal breathing pattern during sleep. She has never had choking arousals. She rarely awakens during the night, and feels that she sleeps well. She is rested in the mornings upon arising, and has normal alertness during the day, even in the afternoon. She denies any issues in the evening watching television or movies, and has no sleepiness with driving. She states that [name] weight is up 10-15 pounds over the last 2 years, but [name] Epworth score today is only 1.   Sleep Questionnaire What time do you typically go to bed?( Between what hours) 10-12am 10-12am at 1614 on 02/15/14 by Lilli Few, CMA How long does it take you to fall asleep? 30 mins 30 mins at 1614 on 02/15/14 by Lilli Few, CMA How many times during the night do you wake up? 1 1 at 1614 on 02/15/14 by Lilli Few, CMA What time do you get out of bed to start your day? 0530 0530 at 1614 on 02/15/14 by Lilli Few, CMA Do you drive or operate heavy machinery in your occupation? No No at 1614 on 02/15/14 by Lilli Few, CMA How much has your weight changed (up or down) over the past two years? (In pounds) 15 lb (6.804 kg) 15 lb (6.804 kg) at 1614 on 02/15/14 by Lilli Few, CMA Have you ever had a sleep study before? No No at 1614 on 02/15/14 by Lilli Few, CMA Do you currently use CPAP? No No at 1614 on 02/15/14 by Lilli Few, CMA Do you wear oxygen at any time? No No at 1614 on 02/15/14 by Lilli Few, CMA   Review of Systems  Constitutional: Negative for fever and unexpected weight change.  HENT: Positive for dental problem and ear pain. Negative for congestion, nosebleeds, postnasal drip, rhinorrhea, sinus pressure, sneezing, sore throat and trouble swallowing.   Eyes: Negative for redness and itching.  Respiratory: Negative for cough, chest tightness, shortness of breath and wheezing.   Cardiovascular: Negative for palpitations and leg swelling.  Gastrointestinal: Negative for nausea and vomiting.  Genitourinary: Negative for dysuria.  Musculoskeletal: Negative for joint swelling.  Skin: Negative for rash.  Neurological: Negative for headaches.  Hematological: Does not bruise/bleed easily.  Psychiatric/Behavioral: Negative for dysphoric mood. The patient is not nervous/anxious.        Objective:   Physical Exam Constitutional:  Morbidly obese female, no acute distress  HENT:  Nares patent without discharge  Oropharynx without exudate, palate elongated, uvula normal, small posterior pharyngeal space  Eyes:  Perrla, eomi, no scleral icterus  Neck:  No JVD, no TMG.  Large neck size  Cardiovascular:  Normal rate, regular rhythm, no rubs or gallops.  No murmurs        Intact distal pulses  Pulmonary :  Normal breath sounds, no stridor or respiratory distress  No rales, rhonchi, or wheezing  Abdominal:  Soft, nondistended, bowel sounds present.  No tenderness noted.   Musculoskeletal:  mild lower extremity edema noted.  Lymph Nodes:  No cervical lymphadenopathy noted  Skin:  No cyanosis noted  Neurologic:  Alert, appropriate, moves all 4 extremities without obvious deficit.         Assessment & Plan:

## 2014-02-15 NOTE — Assessment & Plan Note (Signed)
The patient has a history of mild intermittent snoring, but there is no other history to suggest clinically significant sleep disordered breathing. She is certainly at increased risk for sleep apnea given Paradis obesity, neck size, and known difficult airway. However, this does not necessarily mean that she has sleep apnea. There is no history currently to support the diagnosis of obstructive sleep apnea, however I have asked the patient to work aggressively on weight loss. I have also asked Kasa to discuss all of this with Steyer bed partner, and to remain vigilant for increased symptoms over time. She knows to contact me if she does have worsening symptoms.

## 2014-02-15 NOTE — Patient Instructions (Signed)
You are at increased risk for sleep apnea with your difficult airway, but you do not have any of the typical symptoms for sleep apnea currently.  Would recommend working aggressively on weight loss, and have your bed partner pay close attention to your breathing at night.  Also, think about some of the questions we have discussed today, and let me know if any of these symptoms develop.

## 2014-03-10 ENCOUNTER — Emergency Department (HOSPITAL_COMMUNITY)
Admission: EM | Admit: 2014-03-10 | Discharge: 2014-03-10 | Disposition: A | Discharge status: Home / Self Care | Payer: BC Managed Care – PPO | Attending: Emergency Medicine | Admitting: Emergency Medicine

## 2014-03-10 ENCOUNTER — Encounter (HOSPITAL_COMMUNITY): Payer: Self-pay | Admitting: Emergency Medicine

## 2014-03-10 DIAGNOSIS — E119 Type 2 diabetes mellitus without complications: Secondary | ICD-10-CM | POA: Insufficient documentation

## 2014-03-10 DIAGNOSIS — Z792 Long term (current) use of antibiotics: Secondary | ICD-10-CM | POA: Diagnosis not present

## 2014-03-10 DIAGNOSIS — L089 Local infection of the skin and subcutaneous tissue, unspecified: Secondary | ICD-10-CM

## 2014-03-10 DIAGNOSIS — L03221 Cellulitis of neck: Principal | ICD-10-CM

## 2014-03-10 DIAGNOSIS — L723 Sebaceous cyst: Secondary | ICD-10-CM | POA: Diagnosis not present

## 2014-03-10 DIAGNOSIS — Z794 Long term (current) use of insulin: Secondary | ICD-10-CM | POA: Diagnosis not present

## 2014-03-10 DIAGNOSIS — L0211 Cutaneous abscess of neck: Secondary | ICD-10-CM | POA: Diagnosis present

## 2014-03-10 DIAGNOSIS — Z79899 Other long term (current) drug therapy: Secondary | ICD-10-CM | POA: Diagnosis not present

## 2014-03-10 DIAGNOSIS — Z8742 Personal history of other diseases of the female genital tract: Secondary | ICD-10-CM | POA: Insufficient documentation

## 2014-03-10 DIAGNOSIS — Z87891 Personal history of nicotine dependence: Secondary | ICD-10-CM | POA: Diagnosis not present

## 2014-03-10 DIAGNOSIS — Z8701 Personal history of pneumonia (recurrent): Secondary | ICD-10-CM | POA: Insufficient documentation

## 2014-03-10 DIAGNOSIS — L0390 Cellulitis, unspecified: Secondary | ICD-10-CM

## 2014-03-10 DIAGNOSIS — L0291 Cutaneous abscess, unspecified: Secondary | ICD-10-CM

## 2014-03-10 MED ORDER — LIDOCAINE-EPINEPHRINE 2 %-1:100000 IJ SOLN
20.0000 mL | Freq: Once | INTRAMUSCULAR | Status: AC
  Administered 2014-03-10: 20 mL via INTRADERMAL
  Filled 2014-03-10: qty 1

## 2014-03-10 MED ORDER — ONDANSETRON 8 MG PO TBDP
8.0000 mg | ORAL_TABLET | Freq: Once | ORAL | Status: AC
  Administered 2014-03-10: 8 mg via ORAL
  Filled 2014-03-10: qty 1

## 2014-03-10 MED ORDER — CLINDAMYCIN HCL 150 MG PO CAPS: 450.0000 mg | ORAL_CAPSULE | Freq: Three times a day (TID) | ORAL | Status: DC

## 2014-03-10 MED ORDER — ONDANSETRON HCL 4 MG PO TABS: 4.0000 mg | ORAL_TABLET | Freq: Four times a day (QID) | ORAL | Status: DC

## 2014-03-10 MED ORDER — OXYCODONE-ACETAMINOPHEN 5-325 MG PO TABS: 1.0000 | ORAL_TABLET | Freq: Four times a day (QID) | ORAL | Status: DC | PRN

## 2014-03-10 MED ORDER — OXYCODONE-ACETAMINOPHEN 5-325 MG PO TABS
2.0000 | ORAL_TABLET | Freq: Once | ORAL | Status: AC
  Administered 2014-03-10: 2 via ORAL
  Filled 2014-03-10: qty 2

## 2014-03-10 NOTE — Discharge Instructions (Signed)
Epidermal Cyst An epidermal cyst is sometimes called a sebaceous cyst, epidermal inclusion cyst, or infundibular cyst. These cysts usually contain a substance that looks "pasty" or "cheesy" and may have a bad smell. This substance is a protein called keratin. Epidermal cysts are usually found on the face, neck, or trunk. They may also occur in the vaginal area or other parts of the genitalia of both men and women. Epidermal cysts are usually small, painless, slow-growing bumps or lumps that move freely under the skin. It is important not to try to pop them. This may cause an infection and lead to tenderness and swelling. CAUSES  Epidermal cysts may be caused by a deep penetrating injury to the skin or a plugged hair follicle, often associated with acne. SYMPTOMS  Epidermal cysts can become inflamed and cause:  Redness.  Tenderness.  Increased temperature of the skin over the bumps or lumps.  Grayish-white, bad smelling material that drains from the bump or lump. DIAGNOSIS  Epidermal cysts are easily diagnosed by your caregiver during an exam. Rarely, a tissue sample (biopsy) may be taken to rule out other conditions that may resemble epidermal cysts. TREATMENT   Epidermal cysts often get better and disappear on their own. They are rarely ever cancerous.  If a cyst becomes infected, it may become inflamed and tender. This may require opening and draining the cyst. Treatment with antibiotics may be necessary. When the infection is gone, the cyst may be removed with minor surgery.  Small, inflamed cysts can often be treated with antibiotics or by injecting steroid medicines.  Sometimes, epidermal cysts become large and bothersome. If this happens, surgical removal in your caregiver's office may be necessary. HOME CARE INSTRUCTIONS  Only take over-the-counter or prescription medicines as directed by your caregiver.  Take your antibiotics as directed. Finish them even if you start to feel  better. SEEK MEDICAL CARE IF:   Your cyst becomes tender, red, or swollen.  Your condition is not improving or is getting worse.  You have any other questions or concerns. MAKE SURE YOU:  Understand these instructions.  Will watch your condition.  Will get help right away if you are not doing well or get worse. Document Released: 05/03/2004 Document Revised: 08/25/2011 Document Reviewed: 12/09/2010 ExitCare Patient Information 2015 [employer]. This information is not intended to replace advice given to you by your health care provider. Make sure you discuss any questions you have with your health care provider.  

## 2014-03-10 NOTE — ED Provider Notes (Signed)
CSN: [id number]     Arrival date & time 03/10/14  0059 History   First MD Initiated Contact with Patient 03/10/14 0119     Chief Complaint  Patient presents with  . Cyst     (Consider location/radiation/quality/duration/timing/severity/associated sxs/prior Treatment) Patient is a 33 y.o. female presenting with abscess. The history is provided by the patient. No language interpreter was used.  Abscess Location:  Head/neck Head/neck abscess location:  L neck Size:  4cm Abscess quality: fluctuance, painful, redness and warmth   Red streaking: no   Duration:  2 days Progression:  Worsening Pain details:    Quality:  Dull   Duration:  2 days   Timing:  Constant   Progression:  Worsening Chronicity:  Recurrent Relieved by:  Nothing Worsened by:  Nothing tried Ineffective treatments:  None tried Associated symptoms: fever   Associated symptoms: no anorexia, no fatigue, no headaches, no nausea and no vomiting   Fever:    Duration:  1 day   Timing:  Intermittent   Temp source:  Subjective Risk factors: prior abscess     Past Medical History  Diagnosis Date  . Morbid obesity   . SMOKER   . PNEUMONIA 10/2009    a/w ARDS/sepsis  . AMENORRHEA   . Allergy   . DIABETES MELLITUS, TYPE II    Past Surgical History  Procedure Laterality Date  . Dilation and curettage of uterus      2009  . Ear cyst excision Left 12/12/2013    Procedure: LARYNGOSCOPY;  Surgeon: [name], MD;  Location: [place];  Service: ENT;  Laterality: Left;   Family History  Problem Relation Age of Onset  . Diabetes Mother     gestational  . Cervical cancer Paternal Grandmother    History  Substance Use Topics  . Smoking status: Former Smoker -- 10 years    Types: Cigarettes    Quit date: 01/14/2014  . Smokeless tobacco: Never Used     Comment: Hmong heritage. married summer 2012. working @ [employer]. Quit 12/12/13  . Alcohol Use: Yes     Comment: once a month or less, mixed  drinks   OB History   Grav Para Term Preterm Abortions TAB SAB Ect Mult Living                 Review of Systems  Constitutional: Positive for fever. Negative for chills, diaphoresis, activity change, appetite change and fatigue.  HENT: Negative for congestion, facial swelling, rhinorrhea and sore throat.   Eyes: Negative for photophobia and discharge.  Respiratory: Negative for cough, chest tightness and shortness of breath.   Cardiovascular: Negative for chest pain, palpitations and leg swelling.  Gastrointestinal: Negative for nausea, vomiting, abdominal pain, diarrhea and anorexia.  Endocrine: Negative for polydipsia and polyuria.  Genitourinary: Negative for dysuria, frequency, difficulty urinating and pelvic pain.  Musculoskeletal: Negative for arthralgias, back pain, neck pain and neck stiffness.  Skin: Positive for wound. Negative for color change.  Allergic/Immunologic: Negative for immunocompromised state.  Neurological: Negative for facial asymmetry, weakness, numbness and headaches.  Hematological: Does not bruise/bleed easily.  Psychiatric/Behavioral: Negative for confusion and agitation.      Allergies  Review of patient's allergies indicates no known allergies.  Home Medications   Prior to Admission medications   Medication Sig Start Date End Date Taking? Authorizing Provider  BYDUREON 2 MG PEN Inject 2 mg as directed once a week. On Sundays 03/06/14  Yes Historical Provider, MD  HUMALOG KWIKPEN 100 UNIT/ML injection Take three times a day (20 units at breakfast, 15 at lunch and 40 units at dinner) 07/26/12  Yes Historical Provider, MD  insulin detemir (LEVEMIR) 100 UNIT/ML injection Inject 60 Units into the skin at bedtime. 01/28/13  Yes Rowe Clack, MD  metFORMIN (GLUCOPHAGE-XR) 500 MG 24 hr tablet Take 2 by mouth twice a day 07/09/12  Yes Historical Provider, MD  Multiple Vitamin (MULTIVITAMIN) tablet Take 1 tablet by mouth daily.   Yes Historical Provider,  MD  naproxen sodium (ANAPROX) 220 MG tablet Take 440 mg by mouth 2 (two) times daily as needed (pain).   Yes Historical Provider, MD  clindamycin (CLEOCIN) 150 MG capsule Take 3 capsules (450 mg total) by mouth 3 (three) times daily. 03/10/14   Ernestina Patches, MD  oxyCODONE-acetaminophen (PERCOCET) 5-325 MG per tablet Take 1 tablet by mouth every 6 (six) hours as needed. 03/10/14   Ernestina Patches, MD   BP 112/63  Pulse 96  Temp(Src) 98.5 F (36.9 C) (Oral)  Resp 20  SpO2 100%  LMP 01/26/2014 Physical Exam  Constitutional: She is oriented to person, place, and time. She appears well-developed and well-nourished. No distress.  HENT:  Head: Normocephalic and atraumatic.  Mouth/Throat: No oropharyngeal exudate.  Eyes: Pupils are equal, round, and reactive to light.  Neck: Normal range of motion. Neck supple.    Cardiovascular: Normal rate, regular rhythm and normal heart sounds.  Exam reveals no gallop and no friction rub.   No murmur heard. Pulmonary/Chest: Effort normal and breath sounds normal. No respiratory distress. She has no wheezes. She has no rales.  Abdominal: Soft. Bowel sounds are normal. She exhibits no distension and no mass. There is no tenderness. There is no rebound and no guarding.  Musculoskeletal: Normal range of motion. She exhibits no edema and no tenderness.  Neurological: She is alert and oriented to person, place, and time.  Skin: Skin is warm and dry.  Psychiatric: She has a normal mood and affect.    ED Course  INCISION AND DRAINAGE Date/Time: 03/10/2014 2:37 AM Performed by: Ernestina Patches Authorized by: Ernestina Patches Consent: Verbal consent obtained. written consent not obtained. Risks and benefits: risks, benefits and alternatives were discussed Consent given by: patient Patient understanding: patient states understanding of the procedure being performed Patient consent: the patient's understanding of the procedure matches consent given Procedure  consent: procedure consent matches procedure scheduled Relevant documents: relevant documents present and verified Test results: test results available and properly labeled Site marked: the operative site was marked Imaging studies: imaging studies available Required items: required blood products, implants, devices, and special equipment available Patient identity confirmed: verbally with patient Time out: Immediately prior to procedure a "time out" was called to verify the correct patient, procedure, equipment, support staff and site/side marked as required. Type: abscess Body area: head/neck Location details: neck Anesthesia: local infiltration Local anesthetic: lidocaine 1% with epinephrine Anesthetic total: 4 ml Patient sedated: no Scalpel size: 11 Needle gauge: 25. Incision type: elliptical Complexity: simple Drainage: purulent Drainage amount: copious Wound treatment: drain placed Patient tolerance: Patient tolerated the procedure well with no immediate complications.   (including critical care time) Labs Review Labs Reviewed - No data to display  Imaging Review No results found.   EKG Interpretation None      MDM   Final diagnoses:  Infected sebaceous cyst of skin  Cellulitis and abscess    Pt is a 33 y.o. female with Pmhx as above who presents  with recurrent infection sebaceous cyst left posterior neck. She states that she was to have it removed by general surgery in June but that during the intubation she had a hemorrhage in the back of Lofaso throat and had to be admitted to the ICU and developed respiratory failure and and did not have encouraged to have the surgery done. Has been bothering Goedde more for the past 2 days. She's had subjective fevers and chills.  I&D done w/ copius purulent material. Packing placed. She will f/u with [facility] and either here or PCP in 2 days for wound check. Will place on clinda. Return precautions given for new or worsening symptoms  including worsening pain, swelling, fever.        Ernestina Patches, MD 03/10/14 [phone]

## 2014-03-10 NOTE — ED Notes (Signed)
Patient is alert and oriented x3.  She is complaining of a cyst on Bauder neck that she states  Started to fester 2 days ago.  She adds that she has had this issue before.  Currently she rates Borelli pain 10 of 10.

## 2014-03-22 ENCOUNTER — Ambulatory Visit: Payer: BC Managed Care – PPO | Admitting: Internal Medicine

## 2014-03-22 ENCOUNTER — Ambulatory Visit (INDEPENDENT_AMBULATORY_CARE_PROVIDER_SITE_OTHER): Payer: BC Managed Care – PPO | Admitting: Internal Medicine

## 2014-03-22 ENCOUNTER — Encounter: Payer: Self-pay | Admitting: Internal Medicine

## 2014-03-22 VITALS — BP 110/70 | HR 94 | Temp 98.2°F | Ht 63.0 in | Wt 254.5 lb

## 2014-03-22 DIAGNOSIS — E669 Obesity, unspecified: Secondary | ICD-10-CM

## 2014-03-22 DIAGNOSIS — L089 Local infection of the skin and subcutaneous tissue, unspecified: Secondary | ICD-10-CM | POA: Insufficient documentation

## 2014-03-22 DIAGNOSIS — L723 Sebaceous cyst: Secondary | ICD-10-CM

## 2014-03-22 DIAGNOSIS — E119 Type 2 diabetes mellitus without complications: Secondary | ICD-10-CM

## 2014-03-22 DIAGNOSIS — E1169 Type 2 diabetes mellitus with other specified complication: Secondary | ICD-10-CM

## 2014-03-22 NOTE — Progress Notes (Signed)
Subjective:    Patient ID: Sheri Rasmussen, female    DOB: 1981-04-22, 33 y.o.   MRN: [id number]  HPI  Patient is here for follow up  Reviewed chronic medical issues and interval medical events  Past Medical History  Diagnosis Date  . Morbid obesity   . SMOKER   . PNEUMONIA 10/2009    a/w ARDS/sepsis  . AMENORRHEA   . Allergy   . DIABETES MELLITUS, TYPE II     Review of Systems  Constitutional: Negative for fever, fatigue and unexpected weight change.  Respiratory: Negative for cough and shortness of breath.   Cardiovascular: Negative for chest pain and leg swelling.       Objective:   Physical Exam  BP 110/70  Pulse 94  Temp(Src) 98.2 F (36.8 C) (Oral)  Ht 5\' 3"  (1.6 m)  Wt 254 lb 8 oz (115.44 kg)  BMI 45.09 kg/m2  SpO2 96%  LMP 01/26/2014 Wt Readings from Last 3 Encounters:  03/22/14 254 lb 8 oz (115.44 kg)  02/15/14 256 lb 9.6 oz (116.393 kg)  01/25/14 257 lb 6.4 oz (116.756 kg)   Constitutional: She is obese, and appears well-developed and well-nourished. No distress.  Neck: posterior left neck with I&D incision healing well, minimal swelling, no fluctuance or erythema. No abnormal warmth or spontaneous drainage/cellulitis. Normal range of motion. Neck supple. No JVD present. No thyromegaly present.  Cardiovascular: Normal rate, regular rhythm and normal heart sounds.  No murmur heard. No BLE edema. Pulmonary/Chest: Effort normal and breath sounds normal. No respiratory distress. She has no wheezes.  Psychiatric: She has a normal mood and affect. Lehr behavior is normal. Judgment and thought content normal.   Lab Results  Component Value Date   WBC 14.1* 12/13/2013   HGB 12.7 12/13/2013   HCT 37.8 12/13/2013   PLT 221 12/13/2013   GLUCOSE 142* 12/13/2013   CHOL 175 09/22/2013   TRIG 214.0* 09/22/2013   HDL 34.80* 09/22/2013   LDLDIRECT 106.7 07/28/2012   LDLCALC 97 09/22/2013   ALT 30 12/12/2013   AST 20 12/12/2013   NA 140 12/13/2013   K 3.8 12/13/2013   CL 101 12/13/2013     CREATININE 0.57 12/13/2013   BUN 11 12/13/2013   CO2 25 12/13/2013   TSH 1.79 11/08/2010   INR 1.00 12/12/2013   HGBA1C 8.2* 09/22/2013   MICROALBUR 2.6* 10/29/2011    No results found.     Assessment & Plan:   Problem List Items Addressed This Visit   Diabetes mellitus type 2 in obese      dx 10/2009 during hosp for ARDS improving control by hx - weight mgmt remains issue Has been working with Endo since 07/2011, changed from [place] to [place], then to [facility] (in [place]) 01/2014 at pt request -  Was doing well on Victoza, chnaged by formulary to [facility] [place] but cbgs less controlled than while on Victoza - endo working with insurance to correct same remains on Levemir, metformin and Humalog TID reviewed need for continued med compliance and efforts at weight loss not on ACEI or statin due to desire for eventual pregnancy  Lab Results  Component Value Date   HGBA1C 7.5* 02/09/2014      Relevant Orders      Ambulatory referral to Ophthalmology   Infected sebaceous cyst of skin - Primary     Posterior neck on left side - Has planned excision in OR with gen surg late 6.2015 but surgery aborted due to anesthesia complication with  OP tear/bleeding during intubation (s/p ENT consult for same intraop) pulm consult for OSA - no hx to suggest same per Dr Gwenette Greet 02/15/14 Now with infection of cyst, s/p ED eval 9/25 with ID, improved with same and completing clinda Refer for second opinion in [place] at pt request to pursue excision as orginally planned    Relevant Orders      Ambulatory referral to General Surgery

## 2014-03-22 NOTE — Progress Notes (Signed)
Pre visit review using our clinic review tool, if applicable. No additional management support is needed unless otherwise documented below in the visit note. 

## 2014-03-22 NOTE — Patient Instructions (Signed)
It was good to see you today.  We have reviewed your prior records including labs and tests today  Medications reviewed and updated, no changes recommended at this time.  we'll make referral to gen surg in [place] for your cyst and new eye doctor for diabetes mellitus eye check. Our office will contact you regarding appointment(s) once made.  Please schedule followup in 6 months, call sooner if problems.

## 2014-03-22 NOTE — Assessment & Plan Note (Signed)
Posterior neck on left side - Has planned excision in OR with gen surg late 6.2015 but surgery aborted due to anesthesia complication with OP tear/bleeding during intubation (s/p ENT consult for same intraop) pulm consult for OSA - no hx to suggest same per Dr Gwenette Greet 02/15/14 Now with infection of cyst, s/p ED eval 9/25 with ID, improved with same and completing clinda Refer for second opinion in [place] at pt request to pursue excision as orginally planned

## 2014-03-22 NOTE — Assessment & Plan Note (Signed)
dx 10/2009 during hosp for ARDS improving control by hx - weight mgmt remains issue Has been working with Endo since 07/2011, changed from [place] to [place], then to [facility] (in [place]) 01/2014 at pt request -  Was doing well on Victoza, chnaged by formulary to [facility] but cbgs less controlled than while on Victoza - endo working with insurance to correct same remains on Levemir, metformin and Humalog TID reviewed need for continued med compliance and efforts at weight loss not on ACEI or statin due to desire for eventual pregnancy  Lab Results  Component Value Date   HGBA1C 7.5* 02/09/2014

## 2014-04-04 LAB — HM DIABETES EYE EXAM

## 2014-04-05 ENCOUNTER — Encounter: Payer: Self-pay | Admitting: Internal Medicine

## 2014-04-05 LAB — MICROALBUMIN, URINE
Creatinine, Ur: 39 mg/dL
Microalb, Ur: >12

## 2014-05-05 ENCOUNTER — Encounter: Payer: Self-pay | Admitting: Internal Medicine

## 2014-09-25 ENCOUNTER — Ambulatory Visit: Payer: BC Managed Care – PPO | Admitting: Internal Medicine

## 2014-10-31 ENCOUNTER — Ambulatory Visit (INDEPENDENT_AMBULATORY_CARE_PROVIDER_SITE_OTHER): Payer: BLUE CROSS/BLUE SHIELD | Admitting: Emergency Medicine

## 2014-10-31 VITALS — BP 120/70 | HR 106 | Temp 97.9°F | Resp 16 | Ht 63.25 in | Wt 237.0 lb

## 2014-10-31 DIAGNOSIS — E860 Dehydration: Secondary | ICD-10-CM | POA: Diagnosis not present

## 2014-10-31 DIAGNOSIS — A084 Viral intestinal infection, unspecified: Secondary | ICD-10-CM

## 2014-10-31 MED ORDER — ONDANSETRON 4 MG PO TBDP
8.0000 mg | ORAL_TABLET | Freq: Once | ORAL | Status: AC
  Administered 2014-10-31: 8 mg via ORAL

## 2014-10-31 MED ORDER — LOPERAMIDE HCL 2 MG PO TABS: ORAL_TABLET | ORAL | Status: DC

## 2014-10-31 MED ORDER — ONDANSETRON 8 MG PO TBDP: 8.0000 mg | ORAL_TABLET | Freq: Three times a day (TID) | ORAL | Status: DC | PRN

## 2014-10-31 NOTE — Patient Instructions (Signed)
Clear Liquid Diet A clear liquid diet is a short-term diet that is prescribed to provide the necessary fluid and basic energy you need when you can have nothing else. The clear liquid diet consists of liquids or solids that will become liquid at room temperature. You should be able to see through the liquid. There are many reasons that you may be restricted to clear liquids, such as:  When you have a sudden-onset (acute) condition that occurs before or after surgery.  To help your body slowly get adjusted to food again after a long period when you were unable to have food.  Replacement of fluids when you have a diarrheal disease.  When you are going to have certain exams, such as a colonoscopy, in which instruments are inserted inside your body to look at parts of your digestive system. WHAT CAN I HAVE? A clear liquid diet does not provide all the nutrients you need. It is important to choose a variety of the following items to get as many nutrients as possible:  Vegetable juices that do not have pulp.  Fruit juices and fruit drinks that do not have pulp.  Coffee (regular or decaffeinated), tea, or soda at the discretion of your health care provider.  Clear bouillon, broth, or strained broth-based soups.  High-protein and flavored gelatins.  Sugar or honey.  Ices or frozen ice pops that do not contain milk. If you are not sure whether you can have certain items, you should ask your health care provider. You may also ask your health care provider if there are any other clear liquid options. Document Released: 06/02/2005 Document Revised: 06/07/2013 Document Reviewed: 04/29/2013 ExitCare Patient Information 2015 [employer]. This information is not intended to replace advice given to you by your health care provider. Make sure you discuss any questions you have with your health care provider. Viral Gastroenteritis Viral gastroenteritis is also known as stomach flu. This condition  affects the stomach and intestinal tract. It can cause sudden diarrhea and vomiting. The illness typically lasts 3 to 8 days. Most people develop an immune response that eventually gets rid of the virus. While this natural response develops, the virus can make you quite ill. CAUSES  Many different viruses can cause gastroenteritis, such as rotavirus or noroviruses. You can catch one of these viruses by consuming contaminated food or water. You may also catch a virus by sharing utensils or other personal items with an infected person or by touching a contaminated surface. SYMPTOMS  The most common symptoms are diarrhea and vomiting. These problems can cause a severe loss of body fluids (dehydration) and a body salt (electrolyte) imbalance. Other symptoms may include:  Fever.  Headache.  Fatigue.  Abdominal pain. DIAGNOSIS  Your caregiver can usually diagnose viral gastroenteritis based on your symptoms and a physical exam. A stool sample may also be taken to test for the presence of viruses or other infections. TREATMENT  This illness typically goes away on its own. Treatments are aimed at rehydration. The most serious cases of viral gastroenteritis involve vomiting so severely that you are not able to keep fluids down. In these cases, fluids must be given through an intravenous line (IV). HOME CARE INSTRUCTIONS   Drink enough fluids to keep your urine clear or pale yellow. Drink small amounts of fluids frequently and increase the amounts as tolerated.  Ask your caregiver for specific rehydration instructions.  Avoid:  Foods high in sugar.  Alcohol.  Carbonated drinks.  Tobacco.  Juice.    Caffeine drinks.  Extremely hot or cold fluids.  Fatty, greasy foods.  Too much intake of anything at one time.  Dairy products until 24 to 48 hours after diarrhea stops.  You may consume probiotics. Probiotics are active cultures of beneficial bacteria. They may lessen the amount and  number of diarrheal stools in adults. Probiotics can be found in yogurt with active cultures and in supplements.  Wash your hands well to avoid spreading the virus.  Only take over-the-counter or prescription medicines for pain, discomfort, or fever as directed by your caregiver. Do not give aspirin to children. Antidiarrheal medicines are not recommended.  Ask your caregiver if you should continue to take your regular prescribed and over-the-counter medicines.  Keep all follow-up appointments as directed by your caregiver. SEEK IMMEDIATE MEDICAL CARE IF:   You are unable to keep fluids down.  You do not urinate at least once every 6 to 8 hours.  You develop shortness of breath.  You notice blood in your stool or vomit. This may look like coffee grounds.  You have abdominal pain that increases or is concentrated in one small area (localized).  You have persistent vomiting or diarrhea.  You have a fever.  The patient is a child younger than 3 months, and he or she has a fever.  The patient is a child older than 3 months, and he or she has a fever and persistent symptoms.  The patient is a child older than 3 months, and he or she has a fever and symptoms suddenly get worse.  The patient is a baby, and he or she has no tears when crying. MAKE SURE YOU:   Understand these instructions.  Will watch your condition.  Will get help right away if you are not doing well or get worse. Document Released: 06/02/2005 Document Revised: 08/25/2011 Document Reviewed: 03/19/2011 ExitCare Patient Information 2015 [employer]. This information is not intended to replace advice given to you by your health care provider. Make sure you discuss any questions you have with your health care provider.  

## 2014-10-31 NOTE — Progress Notes (Signed)
   Subjective:    Patient ID: Sheri Rasmussen, female    DOB: 1981-04-10, 34 y.o.   MRN: [id number]  HPI  Patient ill since yesterday with nausea and vomiting Diarrhea "20 times".  The patient has no complaint of blood, mucous, or pus in [place] stools.  Fever last night no chills No ill contacts No exotic travel or sketchy water. No icterus No improvement with over the counter medications or other home remedies.  Denies other complaint or health concern today.   Review of Systems  Constitutional: Positive for fever and fatigue. Negative for chills and appetite change.  HENT: Negative for congestion, ear pain, postnasal drip, sinus pressure and sore throat.   Eyes: Negative for pain and redness.  Respiratory: Negative for cough, shortness of breath and wheezing.   Cardiovascular: Negative for leg swelling.  Gastrointestinal: Positive for nausea, vomiting and diarrhea. Negative for abdominal pain, constipation and blood in stool.  Endocrine: Negative for polyuria.  Genitourinary: Negative for dysuria, urgency, frequency and flank pain.  Musculoskeletal: Negative for gait problem.  Skin: Negative for rash.  Neurological: Negative for weakness and headaches.  Psychiatric/Behavioral: Negative for confusion and decreased concentration. The patient is not nervous/anxious.         Objective:   Physical Exam  Constitutional: She is oriented to person, place, and time. She appears well-developed and well-nourished. No distress.  HENT:  Head: Normocephalic and atraumatic.  Right Ear: External ear normal.  Left Ear: External ear normal.  Nose: Nose normal.  Eyes: Conjunctivae and EOM are normal. Pupils are equal, round, and reactive to light. No scleral icterus.  Neck: Normal range of motion. Neck supple. No tracheal deviation present.  Cardiovascular: Normal rate, regular rhythm and normal heart sounds.   Pulmonary/Chest: Effort normal. No respiratory distress. She has no wheezes. She has no  rales.  Abdominal: Soft. Normal appearance. She exhibits no distension, no ascites and no mass. Bowel sounds are increased. There is no tenderness. There is no rebound, no guarding and negative Murphy's sign.  Musculoskeletal: She exhibits no edema.  Lymphadenopathy:    She has no cervical adenopathy.  Neurological: She is alert and oriented to person, place, and time. Coordination normal.  Skin: Skin is warm and dry. No rash noted.  Psychiatric: She has a normal mood and affect. Lorenzo behavior is normal.          Assessment & Plan:    Viral gastroenteritis Mild dehydration Imodium zofran Clears Appropriate red flags and actions were discussed with the patient who understands.

## 2015-09-12 ENCOUNTER — Ambulatory Visit (INDEPENDENT_AMBULATORY_CARE_PROVIDER_SITE_OTHER): Payer: BLUE CROSS/BLUE SHIELD | Admitting: Family Medicine

## 2015-09-12 VITALS — BP 110/62 | HR 98 | Temp 98.1°F | Resp 17 | Ht 63.0 in | Wt 243.0 lb

## 2015-09-12 DIAGNOSIS — J0100 Acute maxillary sinusitis, unspecified: Secondary | ICD-10-CM

## 2015-09-12 DIAGNOSIS — R059 Cough, unspecified: Secondary | ICD-10-CM

## 2015-09-12 DIAGNOSIS — H65493 Other chronic nonsuppurative otitis media, bilateral: Secondary | ICD-10-CM

## 2015-09-12 DIAGNOSIS — R05 Cough: Secondary | ICD-10-CM

## 2015-09-12 DIAGNOSIS — J320 Chronic maxillary sinusitis: Secondary | ICD-10-CM | POA: Diagnosis not present

## 2015-09-12 MED ORDER — AMOXICILLIN-POT CLAVULANATE 875-125 MG PO TABS: 1.0000 | ORAL_TABLET | Freq: Two times a day (BID) | ORAL | Status: DC

## 2015-09-12 MED ORDER — HYDROCODONE-HOMATROPINE 5-1.5 MG/5ML PO SYRP
5.0000 mL | ORAL_SOLUTION | Freq: Three times a day (TID) | ORAL | Status: DC | PRN
Start: 2015-09-12 — End: 2016-09-02

## 2015-09-12 NOTE — Progress Notes (Signed)
By signing my name below, I, Moises Blood, attest that this documentation has been prepared under the direction and in the presence of Robyn Haber, MD. Electronically Signed: Moises Blood, [place]. 09/12/2015 , 5:02 PM .  Patient was seen in room 12 .   Patient ID: Sheri Rasmussen MRN: [id number], DOB: 09/23/1980, 35 y.o. Date of Encounter: 09/12/2015  Primary Physician: [name], MD  Chief Complaint:  Chief Complaint  Patient presents with  . Laryngitis  . Cough  . URI    HPI:  Sheri Rasmussen is a 35 y.o. female who presents to [facility] complaining of sore throat and congestion that started 5 days ago. She initially had some chills and sweats. She lost Jenkinson voice 3 days ago. She notes that [place] sore throat has subsided but still has congestion, intermittent headaches and feeling fatigue. She denies any chest pain.   She stopped smoking for about a year but resumed in sept 2016. Mcph[place] mother was diagnosed with cancer and she started smoking again at that time due to stress.   She has DM and is on bydureon. [place] last A1c was 5.3.  [place] endocrinologist is Dr. Posey Pronto in [facility].  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   Past Medical History  Diagnosis Date  . Morbid obesity ([place])   . SMOKER   . PNEUMONIA 10/2009    a/w ARDS/sepsis  . AMENORRHEA   . Allergy   . DIABETES MELLITUS, TYPE II      Home Meds: Prior to Admission medications   Medication Sig Start Date End Date Taking? Authorizing Provider  BYDUREON 2 MG PEN Inject 2 mg as directed once a week. On Sundays 03/06/14  Yes Historical Provider, MD  metFORMIN (GLUCOPHAGE-XR) 500 MG 24 hr tablet 500 mg 2 (two) times daily. Take 2 by mouth twice a day 07/09/12  Yes Historical Provider, MD    Allergies: No Known Allergies  Social History   Social History  . Marital Status: Single    Spouse Name: N/A  . Number of Children: N/A  . Years of Education: N/A    Occupational History  . Not on file.   Social History Main Topics  . Smoking status: Current Every Day Smoker -- 0.20 packs/day for 19 years    Types: Cigarettes  . Smokeless tobacco: Never Used     Comment: [place] heritage. married summer 2012. working @ [employer]. Quit 12/12/13  . Alcohol Use: 0.0 oz/week    0 Standard drinks or equivalent per week     Comment: once a month or less, mixed drinks  . Drug Use: No     Comment: Smokes 1-2 cigs a day  . Sexual Activity: Yes    Birth Control/ Protection: None   Other Topics Concern  . Not on file   Social History Narrative     Review of Systems: Constitutional: negative for fever, chills, night sweats, weight changes; positive for fatigue HEENT: negative for vision changes, hearing loss, rhinorrhea, ST, epistaxis, or sinus pressure; positive for laryngitis, congestion Cardiovascular: negative for chest pain or palpitations Respiratory: negative for hemoptysis, wheezing, shortness of breath; positive for cough Abdominal: negative for abdominal pain, nausea, vomiting, diarrhea, or constipation Dermatological: negative for rash Neurologic: negative for dizziness, or syncope; positive for headache All other systems reviewed and are otherwise negative with the exception to those above and in the HPI.  Physical Exam:  Blood pressure 110/62, pulse 98, temperature 98.1 F (36.7 C), temperature source Oral, resp. rate 17, height 5\' 3"  (1.6 m), weight 243 lb (110.224 kg), last menstrual period 08/30/2015, SpO2 98 %., Body mass index is 43.06 kg/(m^2). General: Well developed, well nourished, in no acute distress. Head: Normocephalic, atraumatic, eyes without discharge, sclera non-icteric, nares are without discharge. Oral cavity moist, posterior pharynx without exudate, erythema, peritonsillar abscess, or post nasal drip. both ears have purulent fluid coming perforated ear drums Neck: Supple. No thyromegaly. Full ROM. No  lymphadenopathy. Lungs: Clear bilaterally to auscultation without wheezes, rales, or rhonchi. Breathing is unlabored. Heart: RRR with S1 S2. No murmurs, rubs, or gallops appreciated. Msk:  Strength and tone normal for age. Extremities/Skin: Warm and dry. No clubbing or cyanosis. No edema. No rashes or suspicious lesions. Neuro: Alert and oriented X 3. Moves all extremities spontaneously. Gait is normal. CNII-XII grossly in tact. Psych:  Responds to questions appropriately with a normal affect.    ASSESSMENT AND PLAN:  35 y.o. 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     Signed, Robyn Haber, MD 09/12/2015 5:02 PM

## 2015-09-12 NOTE — Patient Instructions (Addendum)
  You may want to use Afrin nasal spray (oxymetazoline) before going to bed every night to really open [place] sinuses better.  Please let me know if you're not feeling significantly better by the weekend.  Do your best to reduce and then quit smoking.

## 2015-09-26 IMAGING — CR DG CHEST 1V PORT
3 series · 3 of 3 positions shown · non-contrast
Comparison: 11/20/2009

CLINICAL DATA: Acute respiratory failure. On ventilator.

EXAM:
PORTABLE CHEST - 1 VIEW

[AP (1 of 3)]
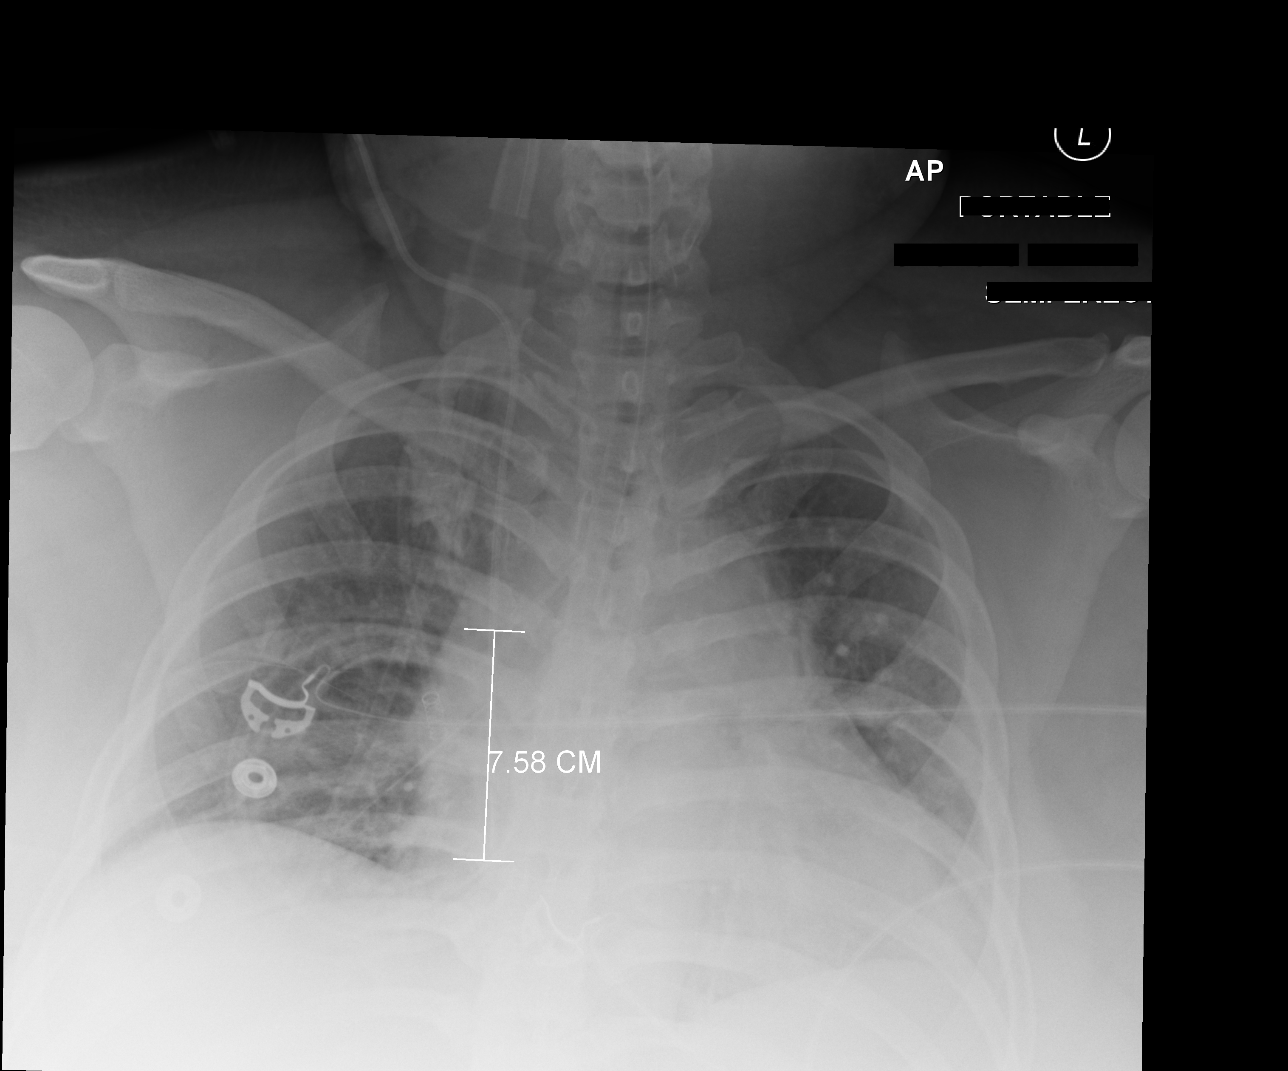

[AP (2 of 3)]
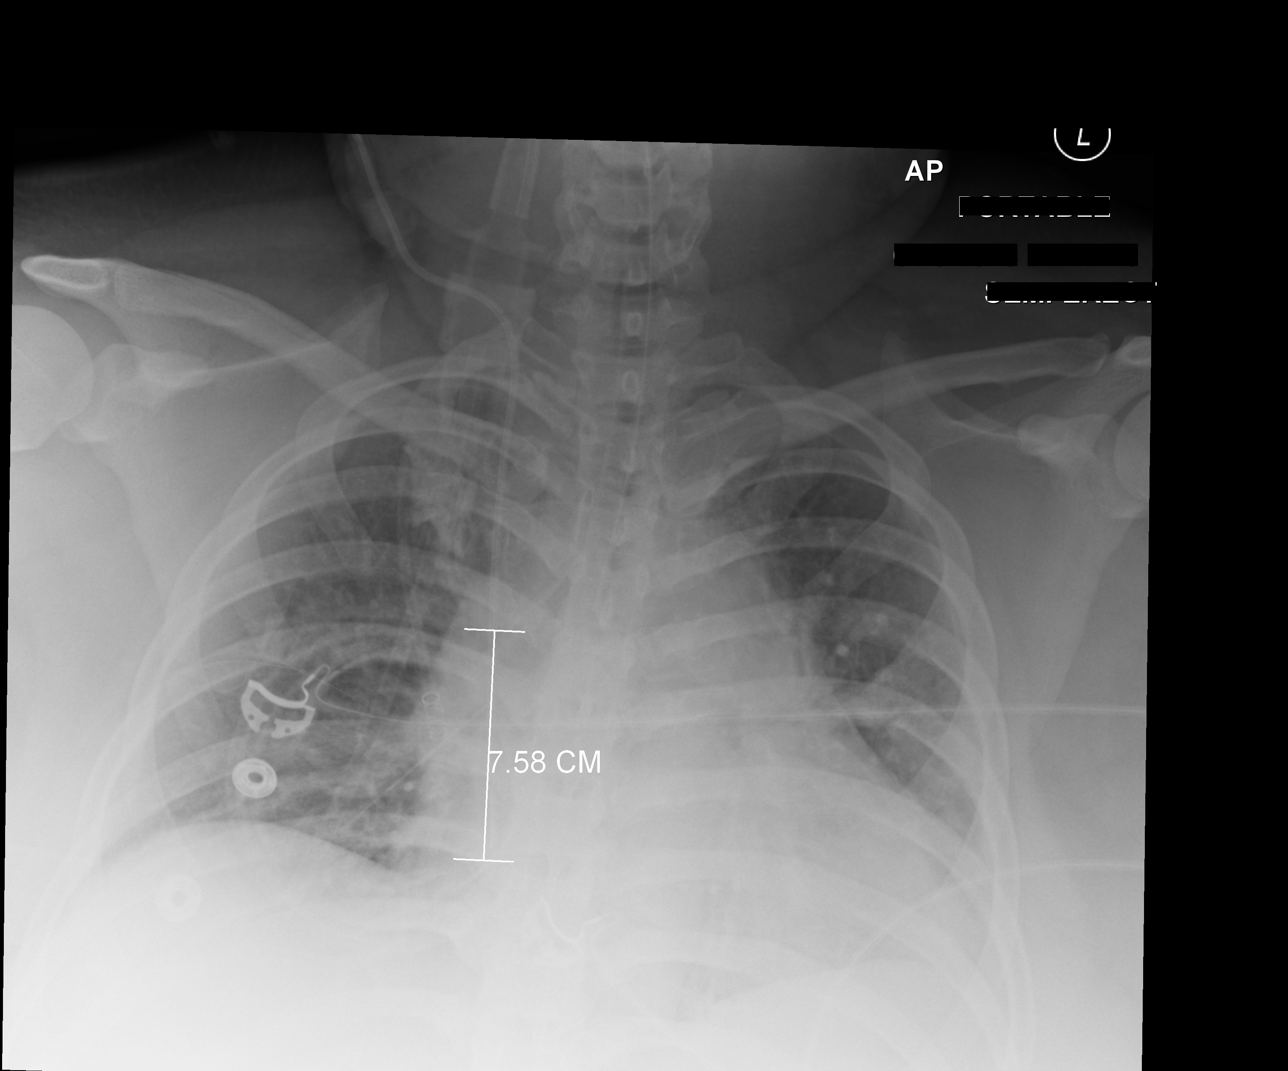

[AP (3 of 3)]
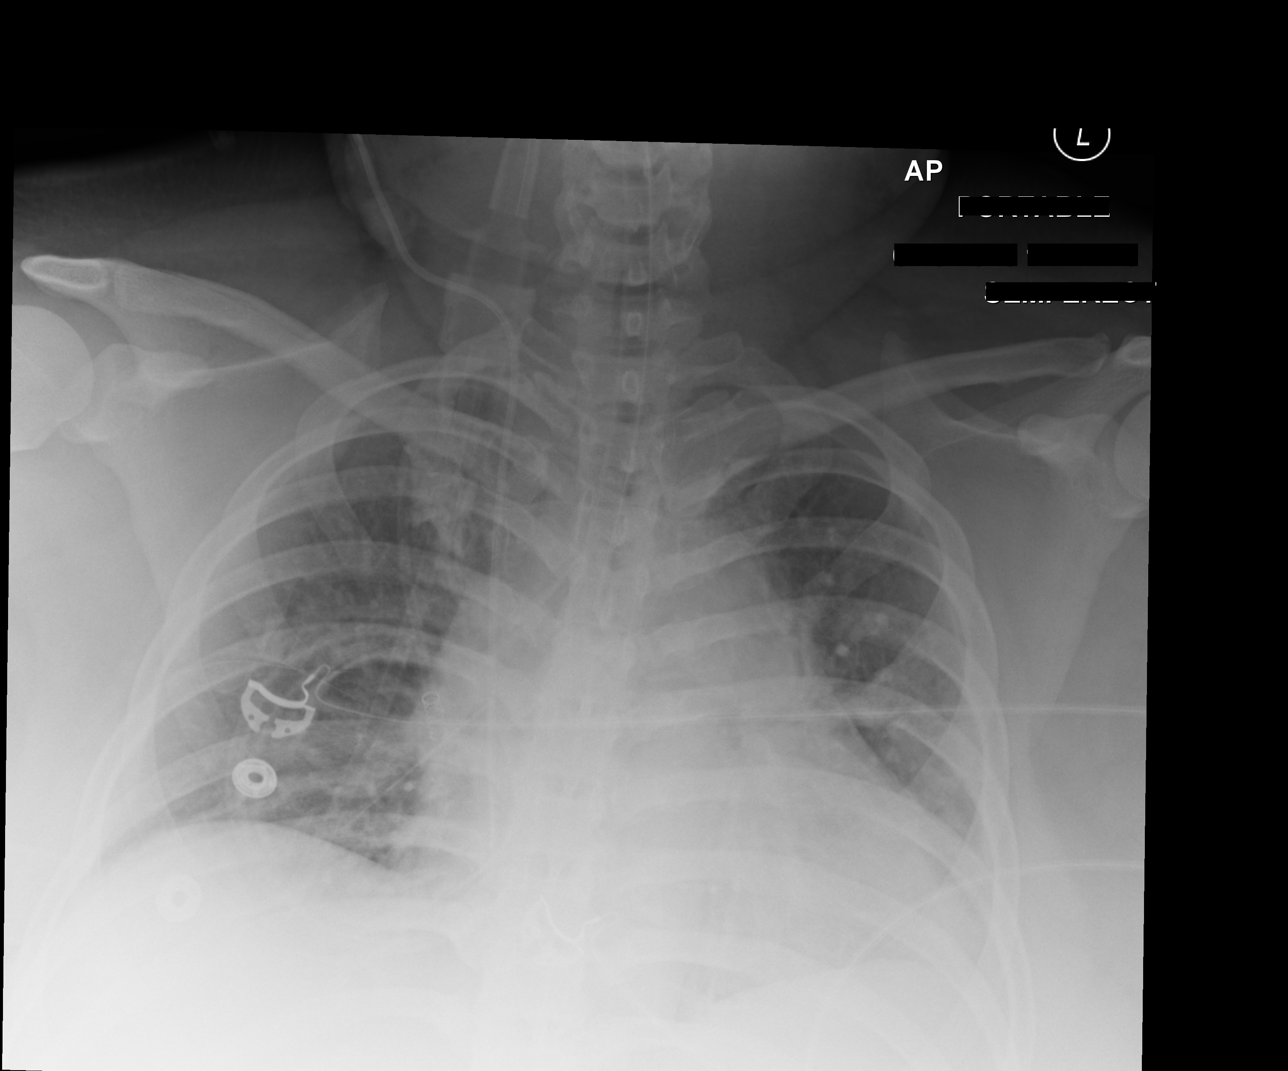

[3 of 3 positions shown; findings below may reference images not displayed]

FINDINGS: Endotracheal tube tip is approximately 5 mm above the carina. Low
lung volumes are noted, however there is no evidence of pulmonary
consolidation or pleural effusion.

A right internal jugular central venous catheter is seen with tip
overlying the right atrium. No pneumothorax identified.
IMPRESSION: Endotracheal tube tip approximately 5 mm above carina. Right
internal jugular central venous catheter tip overlies the right
atrium.

Low lung volumes.  No evidence of pneumothorax.

## 2015-09-27 IMAGING — CR DG CHEST 1V PORT
1 series · 1 of 1 positions shown · non-contrast
Comparison: 02/11/2014

CLINICAL DATA: Postextubation.  Evaluate for aspiration pneumonia.

EXAM:
PORTABLE CHEST - 1 VIEW

[portable]
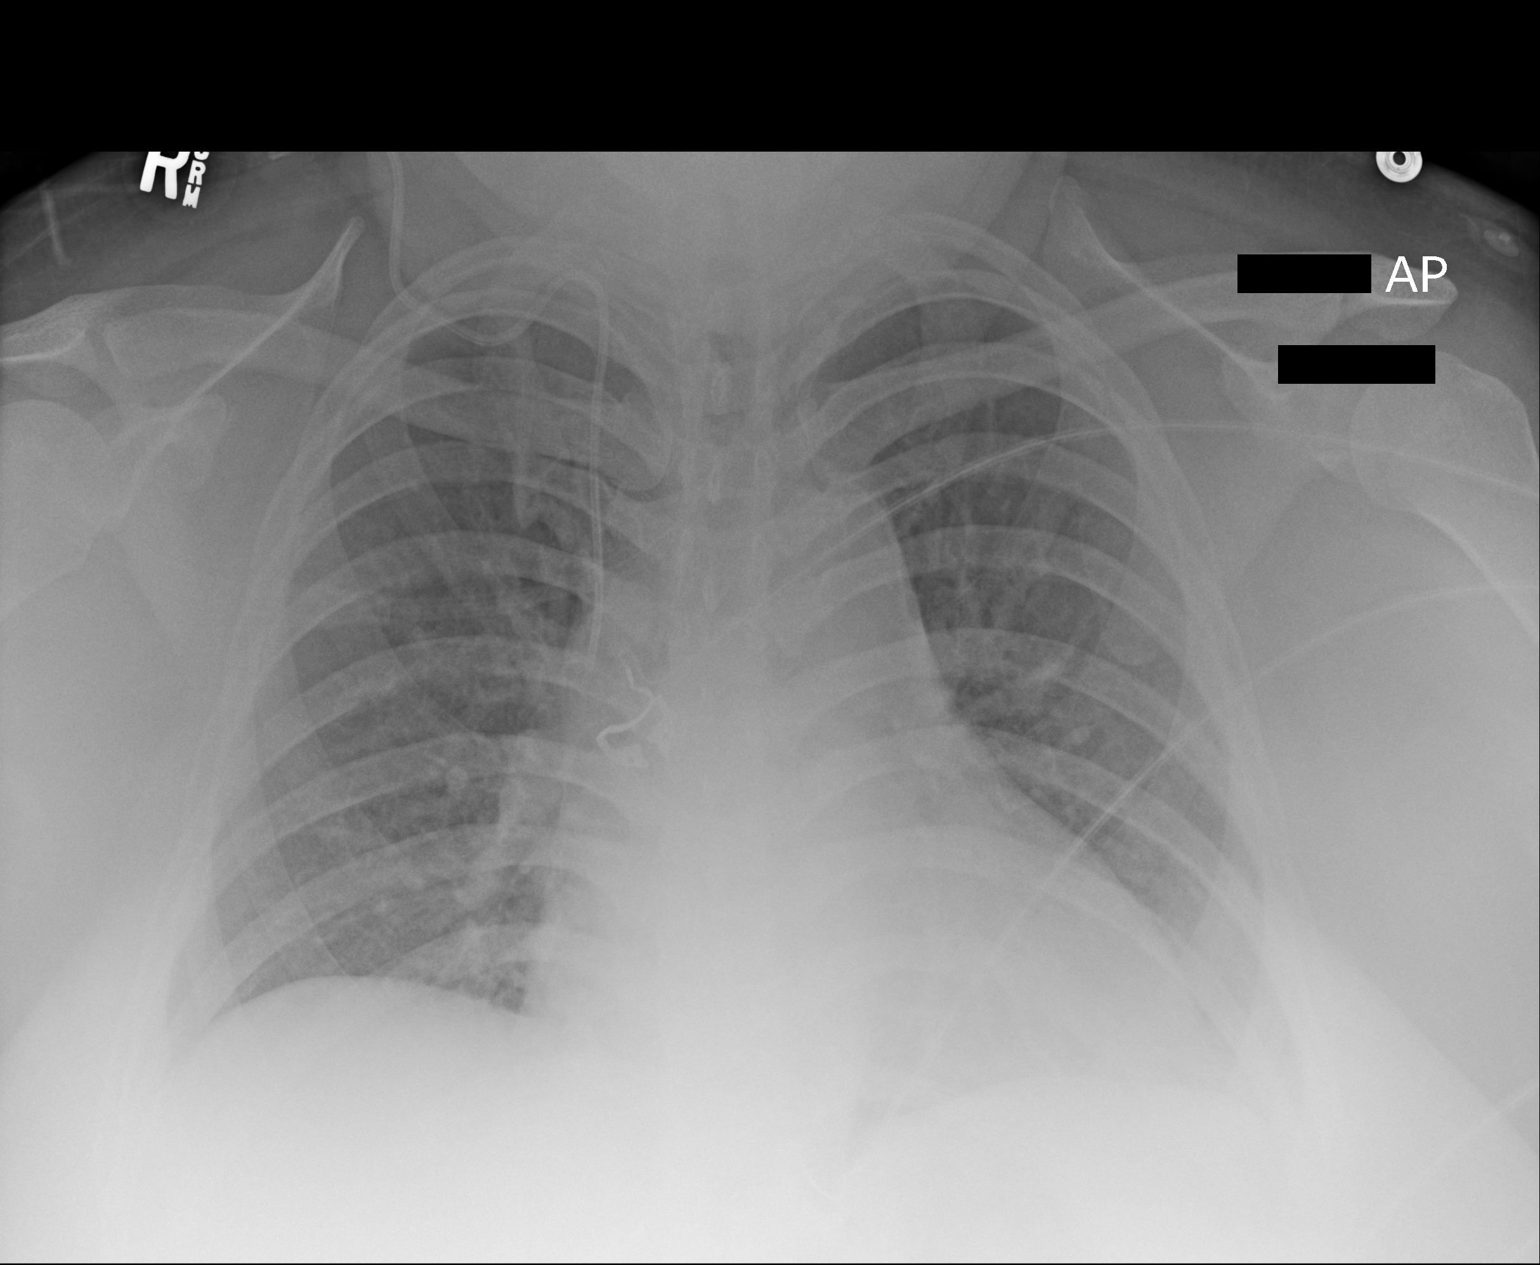

[1 of 1 positions shown; findings below may reference images not displayed]

FINDINGS: Patient has right IJ central line, tip overlying the level of the
superior vena cava. The heart size is accentuated by the portable AP
technique. Endotracheal tube has been removed. There is minimal left
base atelectasis. Otherwise the lungs are clear. No overt edema.
IMPRESSION: Improved aeration postextubation.

## 2016-09-01 ENCOUNTER — Encounter (HOSPITAL_COMMUNITY): Payer: Self-pay | Admitting: Emergency Medicine

## 2016-09-01 DIAGNOSIS — R1110 Vomiting, unspecified: Secondary | ICD-10-CM | POA: Insufficient documentation

## 2016-09-01 DIAGNOSIS — Z5321 Procedure and treatment not carried out due to patient leaving prior to being seen by health care provider: Secondary | ICD-10-CM | POA: Insufficient documentation

## 2016-09-01 LAB — CBG MONITORING, ED: Glucose-Capillary: 210 mg/dL — ABNORMAL HIGH (ref 65–99)

## 2016-09-01 MED ORDER — ONDANSETRON 4 MG PO TBDP
4.0000 mg | ORAL_TABLET | Freq: Once | ORAL | Status: AC | PRN
  Administered 2016-09-02: 4 mg via ORAL
  Filled 2016-09-01: qty 1

## 2016-09-01 NOTE — ED Triage Notes (Signed)
Pt c/o nausea and vomiting; pt has been dealing with recurrent nausea for 2-3 months; episodes of nausea would last for a few days and then resolve; this episode of nausea began on Saturday and developed into vomiting today around 2pm; pt has vomited 6-7 times today; denies diarrhea, dysuria, and pain

## 2016-09-02 ENCOUNTER — Ambulatory Visit (INDEPENDENT_AMBULATORY_CARE_PROVIDER_SITE_OTHER): Payer: BLUE CROSS/BLUE SHIELD

## 2016-09-02 ENCOUNTER — Ambulatory Visit (INDEPENDENT_AMBULATORY_CARE_PROVIDER_SITE_OTHER): Payer: BLUE CROSS/BLUE SHIELD | Admitting: Family Medicine

## 2016-09-02 ENCOUNTER — Ambulatory Visit (HOSPITAL_COMMUNITY)
Admission: RE | Admit: 2016-09-02 | Discharge: 2016-09-02 | Disposition: A | Discharge status: Home / Self Care | Payer: BLUE CROSS/BLUE SHIELD | Source: Ambulatory Visit | Attending: Family Medicine | Admitting: Family Medicine

## 2016-09-02 ENCOUNTER — Emergency Department (HOSPITAL_COMMUNITY)
Admission: EM | Admit: 2016-09-02 | Discharge: 2016-09-02 | Disposition: A | Discharge status: Home / Self Care | Payer: Self-pay | Attending: Emergency Medicine | Admitting: Emergency Medicine

## 2016-09-02 VITALS — BP 126/86 | HR 94 | Temp 98.2°F | Resp 16 | Ht 63.0 in | Wt 238.6 lb

## 2016-09-02 DIAGNOSIS — R1013 Epigastric pain: Secondary | ICD-10-CM | POA: Diagnosis not present

## 2016-09-02 DIAGNOSIS — D270 Benign neoplasm of right ovary: Secondary | ICD-10-CM | POA: Insufficient documentation

## 2016-09-02 DIAGNOSIS — K829 Disease of gallbladder, unspecified: Secondary | ICD-10-CM | POA: Insufficient documentation

## 2016-09-02 DIAGNOSIS — R112 Nausea with vomiting, unspecified: Secondary | ICD-10-CM | POA: Insufficient documentation

## 2016-09-02 DIAGNOSIS — E1165 Type 2 diabetes mellitus with hyperglycemia: Secondary | ICD-10-CM

## 2016-09-02 DIAGNOSIS — K760 Fatty (change of) liver, not elsewhere classified: Secondary | ICD-10-CM | POA: Insufficient documentation

## 2016-09-02 DIAGNOSIS — D72829 Elevated white blood cell count, unspecified: Secondary | ICD-10-CM

## 2016-09-02 LAB — POCT CBC
GRANULOCYTE PERCENT: 67.2 % (ref 37–80)
HCT, POC: 45.8 % (ref 37.7–47.9)
Hemoglobin: 16.1 g/dL (ref 12.2–16.2)
LYMPH, POC: 3.6 — AB (ref 0.6–3.4)
MCH, POC: 32 pg — AB (ref 27–31.2)
MCHC: 35.1 g/dL (ref 31.8–35.4)
MCV: 91 fL (ref 80–97)
MID (cbc): 0.2 (ref 0–0.9)
MPV: 8.4 fL (ref 0–99.8)
PLATELET COUNT, POC: 247 10*3/uL (ref 142–424)
POC Granulocyte: 7.7 — AB (ref 2–6.9)
POC LYMPH %: 31.1 % (ref 10–50)
POC MID %: 1.7 %M (ref 0–12)
RBC: 5.04 M/uL (ref 4.04–5.48)
RDW, POC: 13.6 %
WBC: 11.5 10*3/uL — AB (ref 4.6–10.2)

## 2016-09-02 LAB — POCT URINALYSIS DIP (MANUAL ENTRY)
BILIRUBIN UA: NEGATIVE
Glucose, UA: NEGATIVE
Nitrite, UA: NEGATIVE
PH UA: 7 (ref 5.0–8.0)
Protein Ur, POC: NEGATIVE
RBC UA: NEGATIVE
SPEC GRAV UA: 1.01 (ref 1.030–1.035)
Urobilinogen, UA: 0.2 (ref ?–2.0)

## 2016-09-02 LAB — POC MICROSCOPIC URINALYSIS (UMFC)

## 2016-09-02 LAB — GLUCOSE, POCT (MANUAL RESULT ENTRY): POC GLUCOSE: 167 mg/dL — AB (ref 70–99)

## 2016-09-02 LAB — POCT URINE PREGNANCY: Preg Test, Ur: NEGATIVE

## 2016-09-02 LAB — POCT I-STAT CREATININE: CREATININE: 0.7 mg/dL (ref 0.44–1.00)

## 2016-09-02 MED ORDER — IOPAMIDOL (ISOVUE-300) INJECTION 61%
30.0000 mL | Freq: Once | INTRAVENOUS | Status: AC | PRN
  Administered 2016-09-02: 30 mL via ORAL

## 2016-09-02 MED ORDER — IOPAMIDOL (ISOVUE-300) INJECTION 61%
INTRAVENOUS | Status: AC
  Filled 2016-09-02: qty 100

## 2016-09-02 MED ORDER — ONDANSETRON HCL 4 MG PO TABS: 4.0000 mg | ORAL_TABLET | Freq: Three times a day (TID) | ORAL | 0 refills | Status: DC | PRN

## 2016-09-02 MED ORDER — IOPAMIDOL (ISOVUE-300) INJECTION 61%
INTRAVENOUS | Status: DC
Start: 2016-09-02 — End: 2016-09-03
  Filled 2016-09-02: qty 30

## 2016-09-02 MED ORDER — IOPAMIDOL (ISOVUE-300) INJECTION 61%
100.0000 mL | Freq: Once | INTRAVENOUS | Status: AC | PRN
  Administered 2016-09-02: 100 mL via INTRAVENOUS

## 2016-09-02 NOTE — Patient Instructions (Addendum)
Please go to [facility] ED and let them know you have a scheduled appointment    IF you received an x-ray today, you will receive an invoice from [facility]. Please contact [facility] at [phone] with questions or concerns regarding your invoice.   IF you received labwork today, you will receive an invoice from [facility]. Please contact [facility] at [phone] with questions or concerns regarding your invoice.   Our billing staff will not be able to assist you with questions regarding bills from these companies.  You will be contacted with the lab results as soon as they are available. The fastest way to get your results is to activate your My Chart account. Instructions are located on the last page of this paperwork. If you have not heard from [place] regarding the results in 2 weeks, please contact this office.

## 2016-09-02 NOTE — Progress Notes (Addendum)
Subjective:  By signing my name below, I, Moises Blood, attest that this documentation has been prepared under the direction and in the presence of Merri Ray, MD. Electronically Signed: Moises Blood, [place]. 09/02/2016 , 5:54 PM .  Patient was seen in Room 9 .   Patient ID: Sheri Rasmussen, female    DOB: [birth year], 36 y.o.   MRN: [id number] Chief Complaint  Patient presents with  . nausea    x 4 days, started vomiting yesterday afternoon, went to [facility] ED given nausea med, pt left without being seen due to wait time   HPI Sheri Rasmussen is a 36 y.o. female  She has history of type 2 diabetes. Patient reports 4 days history of nausea. She went to ER yesterday but left prior to being seen due to wait time. She had glucose 210 done in ER last night at 11:56PM. She was given Zofran 4mg  in the ER last night for some relief.   [place] last meal was yesterday afternoon. She's been able to drink and keep fluids down. [place] urine has been normal. She's been having 4 days of vomiting and nausea. She also notes abdominal soreness on the sides bilaterally starting this morning. She had about 10~11 bouts of vomiting yesterday, describing "brown vomit". She denies any blood in vomit or "coffee ground looking". She denies diarrhea, fever, chills, chest pain or chest heaviness. She denies any alcohol intake. She denies history of pancreatitis, or any abdominal surgeries. She denies lightheadedness or dizziness.   She also mentions without having a period for 2 months. She's had negative home pregnancy tests. She has nausea a few times a month, but not to this degree.   Diabetes I reviewed [place] records from [facility] notes and labs. [place] diabetes is managed by endocrinologist, Dr. Posey Pronto (in [facility]). [place] last A1C was 8.9 on Oct 5th, 2017. She is taking Trulicity, and Metformin 500mg  bid.   Patient Active Problem List   Diagnosis Date Noted  . AMENORRHEA 07/26/2010  . MORBID OBESITY 12/04/2009  . SMOKER  12/04/2009  . Diabetes mellitus type 2 in obese ([place]) 10/17/2009   Past Medical History:  Diagnosis Date  . Allergy   . AMENORRHEA   . DIABETES MELLITUS, TYPE II   . Morbid obesity ([place])   . PNEUMONIA 10/2009   a/w ARDS/sepsis  . SMOKER    Past Surgical History:  Procedure Laterality Date  . DILATION AND CURETTAGE OF UTERUS     2009  . EAR CYST EXCISION Left 12/12/2013   Procedure: LARYNGOSCOPY;  Surgeon: [name], MD;  Location: [place];  Service: ENT;  Laterality: Left;   No Known Allergies Prior to Admission medications   Medication Sig Start Date End Date Taking? Authorizing Provider  Dulaglutide (TRULICITY [place]) Inject into the skin.   Yes Historical Provider, MD  metFORMIN (GLUCOPHAGE-XR) 500 MG 24 hr tablet 500 mg 2 (two) times daily. Take 2 by mouth twice a day 07/09/12  Yes Historical Provider, MD  BYDUREON 2 MG PEN Inject 2 mg as directed once a week. On Sundays 03/06/14   Historical Provider, MD  HYDROcodone-homatropine (HYCODAN) 5-1.5 MG/5ML syrup Take 5 mLs by mouth every 8 (eight) hours as needed for cough. Patient not taking: Reported on 09/02/2016 09/12/15   Robyn Haber, MD   Social History   Social History  . Marital status: Single    Spouse name: N/A  . Number of children: N/A  . Years of education: N/A  Occupational History  . Not on file.   Social History Main Topics  . Smoking status: Current Every Day Smoker    Packs/day: 0.20    Years: 19.00    Types: Cigarettes  . Smokeless tobacco: Never Used     Comment: [place] heritage. married summer 2012. working @ [employer]. Quit 12/12/13  . Alcohol use 0.0 oz/week     Comment: once a month or less, mixed drinks  . Drug use: No     Comment: Smokes 1-2 cigs a day  . Sexual activity: Yes    Birth control/ protection: None   Other Topics Concern  . Not on file   Social History Narrative  . No narrative on file   Review of Systems  Constitutional: Positive for appetite  change. Negative for chills, fatigue and fever.  Respiratory: Negative for cough, shortness of breath and wheezing.   Cardiovascular: Negative for chest pain and palpitations.  Gastrointestinal: Positive for abdominal pain, nausea and vomiting. Negative for blood in stool, constipation and diarrhea.  Neurological: Negative for dizziness and light-headedness.       Objective:   Physical Exam  Constitutional: She is oriented to person, place, and time. She appears well-developed and well-nourished. No distress.  HENT:  Head: Normocephalic and atraumatic.  Eyes: EOM are normal. Pupils are equal, round, and reactive to light.  Neck: Neck supple.  Cardiovascular: Normal rate.   Pulmonary/Chest: Effort normal. No respiratory distress.  Abdominal: Bowel sounds are decreased. There is tenderness (slight) in the epigastric area. There is no CVA tenderness and no tenderness at McBurney's point.  RUQ not particular tender  Musculoskeletal: Normal range of motion.  Neurological: She is alert and oriented to person, place, and time.  Skin: Skin is warm and dry.  Psychiatric: She has a normal mood and affect. Rodgers behavior is normal.  Nursing note and vitals reviewed.   Vitals:   09/02/16 1638  BP: 126/86  Pulse: 94  Resp: 16  Temp: 98.2 F (36.8 C)  TempSrc: Oral  SpO2: 94%  Weight: 238 lb 9.6 oz (108.2 kg)  Height: 5\' 3"  (1.6 m)   Results for orders placed or performed in visit on 09/02/16  POCT Microscopic Urinalysis (UMFC)  Result Value Ref Range   WBC,UR,HPF,POC  None WBC/hpf   RBC,UR,HPF,POC Few (A) None RBC/hpf   Bacteria None None, Too numerous to count   Mucus Present (A) Absent   Epithelial Cells, UR Per Microscopy Moderate (A) None, Too numerous to count cells/hpf  POCT urinalysis dipstick  Result Value Ref Range   Color, UA yellow yellow   Clarity, UA cloudy (A) clear   Glucose, UA negative negative   Bilirubin, UA negative negative   Ketones, POC UA small (15) (A)  negative   Spec Grav, UA 1.010 1.030 - 1.035   Blood, UA negative negative   pH, UA 7.0 5.0 - 8.0   Protein Ur, POC negative negative   Urobilinogen, UA 0.2 Negative - 2.0   Nitrite, UA Negative Negative   Leukocytes, UA Trace (A) Negative  POCT CBC  Result Value Ref Range   WBC 11.5 (A) 4.6 - 10.2 K/uL   Lymph, poc 3.6 (A) 0.6 - 3.4   POC LYMPH PERCENT 31.1 10 - 50 %L   MID (cbc) 0.2 0 - 0.9   POC MID % 1.7 0 - 12 %M   POC Granulocyte 7.7 (A) 2 - 6.9   Granulocyte percent 67.2 37 - 80 %G   RBC  5.04 4.04 - 5.48 M/uL   Hemoglobin 16.1 12.2 - 16.2 g/dL   HCT, POC 45.8 37.7 - 47.9 %   MCV 91.0 80 - 97 fL   MCH, POC 32.0 (A) 27 - 31.2 pg   MCHC 35.1 31.8 - 35.4 g/dL   RDW, POC 13.6 %   Platelet Count, POC 247 142 - 424 K/uL   MPV 8.4 0 - 99.8 fL  POCT glucose (manual entry)  Result Value Ref Range   POC Glucose 167 (A) 70 - 99 mg/dl  POCT urine pregnancy  Result Value Ref Range   Preg Test, Ur Negative Negative   Ct Abdomen Pelvis W Contrast  Result Date: 09/02/2016 CLINICAL DATA:  Nausea vomiting EXAM: CT ABDOMEN AND PELVIS WITH CONTRAST TECHNIQUE: Multidetector CT imaging of the abdomen and pelvis was performed using the standard protocol following bolus administration of intravenous contrast. CONTRAST:  22mL ISOVUE-300 IOPAMIDOL (ISOVUE-300) INJECTION 61%, 142mL ISOVUE-300 IOPAMIDOL (ISOVUE-300) INJECTION 61% COMPARISON:  10/25/2009 FINDINGS: Lower chest: Mild ground-glass density in the left lower lobe suspect atelectasis. No focal consolidation or pleural effusion. The heart is borderline enlarged. Hepatobiliary: Enlarged liver at 22 cm craniocaudad. Diffuse low attenuation consistent with steatosis. No focal hepatic abnormality. Possible high density sludge in the gallbladder lumen. No wall thickening. Pancreas: Unremarkable. No pancreatic ductal dilatation or surrounding inflammatory changes. Spleen: Normal in size without focal abnormality. Adrenals/Urinary Tract: Adrenal  glands are unremarkable. Kidneys are normal, without renal calculi, focal lesion, or hydronephrosis. Bladder is unremarkable. Stomach/Bowel: Stomach is within normal limits. Appendix appears normal. No evidence of bowel wall thickening, distention, or inflammatory changes. Vascular/Lymphatic: No significant vascular findings are present. No enlarged abdominal or pelvic lymph nodes. Reproductive: Uterus unremarkable. There is a 2.6 x 3.3 cm right ovarian dermoid. Other: No abdominal wall hernia or abnormality. No abdominopelvic ascites. Musculoskeletal: No acute or suspicious bone lesion. Degenerative disc changes with posterior disc osteophyte complex at L5-S1. IMPRESSION: 1. No CT evidence for acute intra-abdominal or pelvic pathology 2. Enlarged fatty liver 3. Possible gallbladder sludge 4. 3.3 cm right ovarian dermoid, surgical consultation is recommended. Electronically Signed   By: Donavan Foil M.D.   On: 09/02/2016 22:58   Dg Abd Acute W/chest  Result Date: 09/02/2016 CLINICAL DATA:  Forty history of nausea and vomiting. Upper abdominal and epigastric pain. Evaluate for small bowel obstruction. EXAM: DG ABDOMEN ACUTE W/ 1V CHEST COMPARISON:  Chest radiograph - 12/13/2013; 12/12/2013; CT abdomen pelvis- 10/25/2009 FINDINGS: Grossly unchanged cardiac silhouette and mediastinal contours. Interval removal of right jugular approach central venous catheter. No focal airspace opacities. No pleural effusion or pneumothorax. There is a minimal pleuroparenchymal thickening about the peripheral aspect the right minor fissure. No evidence of edema. Paucity of bowel gas without definite evidence of enteric obstruction. Moderate colonic stool burden. No pneumoperitoneum, pneumatosis or portal venous gas. Punctate phleboliths overlie the right hemipelvis. No definitive abnormal intra-abdominal calcifications given overlying colonic stool burden. Degenerative change of the lower lumbar spine is suspected though  incompletely evaluated. IMPRESSION: 1.  No acute cardiopulmonary disease. 2. Paucity of bowel gas without evidence of enteric obstruction. Electronically Signed   By: Sandi Mariscal M.D.   On: 09/02/2016 18:21        Assessment & Plan:   Sheri Rasmussen is a 36 y.o. female Type 2 diabetes mellitus with hyperglycemia, without long-term current use of insulin ([place]) - Plan: POCT glucose (manual entry), CT Abdomen Pelvis W Contrast  Abdominal pain, epigastric - Plan: POCT CBC, DG Abd Acute  W/Chest, Comprehensive metabolic panel, Lipase, CT Abdomen Pelvis W Contrast  Nausea and vomiting, intractability of vomiting not specified, unspecified vomiting type - Plan: POCT Microscopic Urinalysis (UM[facility]), POCT urinalysis dipstick, POCT CBC, POCT glucose (manual entry), DG Abd Acute W/Chest, Comprehensive metabolic panel, Lipase, POCT urine pregnancy, CT Abdomen Pelvis W Contrast, ondansetron (ZOFRAN) 4 MG tablet, I-Stat Creatinine  Leukocytosis, unspecified type - Plan: CT Abdomen Pelvis W Contrast  4 day history nausea/vomiting. Currently takes true SOB, with potential risk for pancreatitis. Check lipase, CMP, CT ordered. Report received without acute findings, but notable for gallbladder sludge, likely fatty liver, and dermoid of right ovary.  - Zofran given for nausea, small sips of fluids frequently/symptomatic care and RTC precautions for nausea/vomiting.   - consider referral to general surgery if nausea/vomiting not improving with gallbladder sludge and reported history of nausea previously.  -For dermoid will refer to OB/GYN if she does not already have one for possible resection/further eval.   Results discussed with patient on night of study, but attempted phone call to [place] mobile number on Wednesday afternoon to discuss dermoid and gallbladder sludge further. Will try to reach [place] again in the next day or 2.   Over 40 minutes care with greater than 50% counseling with plan above and review of results/plan  on phone.   Meds ordered this encounter  Medications  . Dulaglutide (TRULICITY [place])    Sig: Inject into the skin.  Marland Kitchen ondansetron (ZOFRAN) 4 MG tablet    Sig: Take 1 tablet (4 mg total) by mouth every 8 (eight) hours as needed for nausea or vomiting.    Dispense:  20 tablet    Refill:  0   Patient Instructions   Please go to [facility] ED and let them know you have a scheduled appointment    IF you received an x-ray today, you will receive an invoice from [facility]. Please contact [facility] at [phone] with questions or concerns regarding your invoice.   IF you received labwork today, you will receive an invoice from [place]. Please contact [facility] at [phone] with questions or concerns regarding your invoice.   Our billing staff will not be able to assist you with questions regarding bills from these companies.  You will be contacted with the lab results as soon as they are available. The fastest way to get your results is to activate your My Chart account. Instructions are located on the last page of this paperwork. If you have not heard from [place] regarding the results in 2 weeks, please contact this office.      I personally performed the services described in this documentation, which was scribed in my presence. The recorded information has been reviewed and considered for accuracy and completeness, addended by me as needed, and agree with information above.  Signed,   Merri Ray, MD [facility] [place].  09/03/16 3:15 PM

## 2016-09-02 NOTE — ED Notes (Signed)
Writer attempted blood, X 2 unsuccessful.

## 2016-09-02 NOTE — ED Notes (Signed)
Pt stated she was leaving and would come back later

## 2016-09-02 NOTE — ED Notes (Signed)
BLOOD DRAW DELAYED, ATTEMPT X2 UNSUCCESSFUL. RN NOTIFIED.

## 2016-09-03 LAB — COMPREHENSIVE METABOLIC PANEL
ALK PHOS: 68 IU/L (ref 39–117)
ALT: 40 IU/L — AB (ref 0–32)
AST: 54 IU/L — AB (ref 0–40)
Albumin/Globulin Ratio: 1.4 (ref 1.2–2.2)
Albumin: 4.8 g/dL (ref 3.5–5.5)
BUN/Creatinine Ratio: 14 (ref 9–23)
BUN: 10 mg/dL (ref 6–20)
Bilirubin Total: 0.7 mg/dL (ref 0.0–1.2)
CALCIUM: 10 mg/dL (ref 8.7–10.2)
CO2: 24 mmol/L (ref 18–29)
CREATININE: 0.7 mg/dL (ref 0.57–1.00)
Chloride: 94 mmol/L — ABNORMAL LOW (ref 96–106)
GFR calc Af Amer: 129 mL/min/{1.73_m2} (ref >59)
GFR, EST NON AFRICAN AMERICAN: 112 mL/min/{1.73_m2} (ref >59)
GLUCOSE: 187 mg/dL — AB (ref 65–99)
Globulin, Total: 3.4 g/dL (ref 1.5–4.5)
Potassium: 4.5 mmol/L (ref 3.5–5.2)
Sodium: 136 mmol/L (ref 134–144)
Total Protein: 8.2 g/dL (ref 6.0–8.5)

## 2016-09-03 LAB — LIPASE: LIPASE: 43 U/L (ref 14–72)

## 2016-09-05 ENCOUNTER — Telehealth: Payer: Self-pay | Admitting: Family Medicine

## 2016-09-05 NOTE — Telephone Encounter (Signed)
LMOM on mobile and work number to call and advise [place] of best time and number to reach [place] so I can review CT results further and plan.

## 2017-03-16 ENCOUNTER — Encounter (HOSPITAL_COMMUNITY): Payer: Self-pay

## 2017-03-16 ENCOUNTER — Emergency Department (HOSPITAL_COMMUNITY)
Admission: EM | Admit: 2017-03-16 | Discharge: 2017-03-16 | Disposition: A | Discharge status: Home / Self Care | Payer: BLUE CROSS/BLUE SHIELD | Attending: Emergency Medicine | Admitting: Emergency Medicine

## 2017-03-16 DIAGNOSIS — R109 Unspecified abdominal pain: Secondary | ICD-10-CM | POA: Insufficient documentation

## 2017-03-16 DIAGNOSIS — Z5321 Procedure and treatment not carried out due to patient leaving prior to being seen by health care provider: Secondary | ICD-10-CM | POA: Diagnosis not present

## 2017-03-16 LAB — CBG MONITORING, ED: GLUCOSE-CAPILLARY: 352 mg/dL — AB (ref 65–99)

## 2017-03-16 MED ORDER — ONDANSETRON 4 MG PO TBDP
4.0000 mg | ORAL_TABLET | Freq: Once | ORAL | Status: AC | PRN
  Administered 2017-03-16: 4 mg via ORAL
  Filled 2017-03-16: qty 1

## 2017-03-16 NOTE — ED Notes (Signed)
No answer when called x 3.  

## 2017-03-16 NOTE — ED Triage Notes (Addendum)
Patient c/o N/V/D, abdominal pain since 1430 today.  Patient [id number] while in triage.

## 2017-08-27 ENCOUNTER — Ambulatory Visit (INDEPENDENT_AMBULATORY_CARE_PROVIDER_SITE_OTHER): Payer: BLUE CROSS/BLUE SHIELD | Admitting: Physician Assistant

## 2017-08-27 ENCOUNTER — Encounter: Payer: Self-pay | Admitting: Physician Assistant

## 2017-08-27 VITALS — BP 114/80 | HR 107 | Temp 99.0°F | Resp 16 | Ht 63.0 in | Wt 231.0 lb

## 2017-08-27 DIAGNOSIS — L02818 Cutaneous abscess of other sites: Secondary | ICD-10-CM | POA: Diagnosis not present

## 2017-08-27 MED ORDER — CEPHALEXIN 500 MG PO CAPS: 500.0000 mg | ORAL_CAPSULE | Freq: Three times a day (TID) | ORAL | 0 refills | Status: AC

## 2017-08-27 NOTE — Patient Instructions (Addendum)
Please come back on Saturday for a wound recheck.  You can go ahead and get this scheduled on your way out today.  For pain please take ibuprofen 600 mg every 6-8 hours along with Tylenol 1000 mg every 8 hours.    IF you received an x-ray today, you will receive an invoice from [facility]. Please contact [facility] at [phone] with questions or concerns regarding your invoice.   IF you received labwork today, you will receive an invoice from [place]. Please contact [facility] at [phone] with questions or concerns regarding your invoice.   Our billing staff will not be able to assist you with questions regarding bills from these companies.  You will be contacted with the lab results as soon as they are available. The fastest way to get your results is to activate your My Chart account. Instructions are located on the last page of this paperwork. If you have not heard from [place] regarding the results in 2 weeks, please contact this office.

## 2017-08-27 NOTE — Progress Notes (Signed)
    08/27/2017 5:13 PM   DOB: November 15, 1980 / MRN: [id number]  SUBJECTIVE:  Sheri Rasmussen is a 37 y.o. female presenting for abscess.  This is chronic.  Has been worsening over the last 4-5 days.  She has had several drainages about this area.  She tried to see a Psychologist, sport and exercise however tells me this did not work out.  He tells me she will make an appointment with his surgeon at another time.  She denies fever, diaphoresis, change in appetite.  She has No Known Allergies.   She  has a past medical history of Allergy, AMENORRHEA, DIABETES MELLITUS, TYPE II, Morbid obesity ([place]), PNEUMONIA (10/2009), and SMOKER.    She  reports that she has been smoking cigarettes.  She has a 3.80 pack-year smoking history. she has never used smokeless tobacco. She reports that she drinks alcohol. She reports that she does not use drugs. She  reports that she currently engages in sexual activity. She reports using the following method of birth control/protection: None. The patient  has a past surgical history that includes Dilation and curettage of uterus and Ear Cyst Excision (Left, 12/12/2013).  [name] family history includes Cervical cancer in [name] paternal grandmother; Diabetes in [place] mother.  ROS per HPI  The problem list and medications were reviewed and updated by myself where necessary and exist elsewhere in the encounter.   OBJECTIVE:  BP 114/80   Pulse (!) 107   Temp 99 F (37.2 C) (Oral)   Resp 16   Ht 5\' 3"  (1.6 m)   Wt 231 lb (104.8 kg)   SpO2 97%   BMI 40.92 kg/m   Physical Exam  Constitutional: She is active.  Non-toxic appearance.  Cardiovascular: Normal rate.  Pulmonary/Chest: Effort normal. No tachypnea.  Neurological: She is alert.  Skin: Skin is warm and dry. She is not diaphoretic. No pallor.     Procedure risks and benefits discussed and verbal consent obtained.  Patient anesthetized with 2% with lidocaine roughly 4 cc.  1 cm incision made with 11 blade and copious purulence evacuated from the  wound.  The wound was packed with half-inch packing.  Patient tolerated the procedure reasonably well.  Lab Results  Component Value Date   HGBA1C 7.5 (A) 02/09/2014   Lab Results  Component Value Date   CREATININE 0.70 09/02/2016   BUN 10 09/02/2016   NA 136 09/02/2016   K 4.5 09/02/2016   CL 94 (L) 09/02/2016   CO2 24 09/02/2016     No results found for this or any previous visit (from the past 72 hour(s)).  No results found.  ASSESSMENT AND PLAN:  Sheri Rasmussen was seen today for cyst.  Diagnoses and all orders for this visit:  Cutaneous abscess:  Drained here. I will start Sheri Rasmussen on Keflex as she does desire pregnancy. -     cephALEXin (KEFLEX) 500 MG capsule; Take 1 capsule (500 mg total) by mouth 3 (three) times daily for 10 days.    The patient is advised to call or return to clinic if she does not see an improvement in symptoms, or to seek the care of the closest emergency department if she worsens with the above plan.   Philis Fendt, MHS, PA-C [facility] 08/27/2017 5:13 PM

## 2017-08-29 ENCOUNTER — Encounter: Payer: Self-pay | Admitting: Urgent Care

## 2017-08-29 ENCOUNTER — Other Ambulatory Visit: Payer: Self-pay

## 2017-08-29 ENCOUNTER — Ambulatory Visit: Payer: BLUE CROSS/BLUE SHIELD | Admitting: Urgent Care

## 2017-08-29 VITALS — BP 122/84 | HR 98 | Temp 97.9°F | Resp 16 | Ht 63.0 in | Wt 233.8 lb

## 2017-08-29 DIAGNOSIS — L0211 Cutaneous abscess of neck: Secondary | ICD-10-CM

## 2017-08-29 DIAGNOSIS — Z5189 Encounter for other specified aftercare: Secondary | ICD-10-CM

## 2017-08-29 LAB — WOUND CULTURE: Organism ID, Bacteria: NONE SEEN

## 2017-08-29 NOTE — Progress Notes (Signed)
    MRN: [id number] DOB: Apr 02, 1981  Subjective:   Sheri Rasmussen is a 37 y.o. female presenting for follow up on abscess of [place] posterior neck. Last OV was 08/27/2017, had I&D with 2 incisions. She was started on Keflex and had packing placed. Today, reports improvement in [place] pain and swelling. Has drainage but has been changing [place] dressing daily.    [name] has a current medication list which includes the following prescription(s): cephalexin, insulin pen needle, liraglutide, metformin, onetouch delica lancets 30Q, and prenatal mv-min-fe fum-fa-dha. Also has No Known Allergies.  Sheri Rasmussen  has a past medical history of Allergy, AMENORRHEA, DIABETES MELLITUS, TYPE II, Morbid obesity ([place]), PNEUMONIA (10/2009), and SMOKER. Also  has a past surgical history that includes Dilation and curettage of uterus and Ear Cyst Excision (Left, 12/12/2013).  Objective:   Vitals: BP 122/84   Pulse 98   Temp 97.9 F (36.6 C)   Resp 16   Ht 5\' 3"  (1.6 m)   Wt 233 lb 12.8 oz (106.1 kg)   SpO2 98%   BMI 41.42 kg/m   Physical Exam  Constitutional: She is oriented to person, place, and time. She appears well-developed and well-nourished.  HENT:  Head:    Cardiovascular: Normal rate.  Pulmonary/Chest: Effort normal.  Neurological: She is alert and oriented to person, place, and time.   Assessment and Plan :   Encounter for wound care  Abscess of skin of neck  Patient refused I&D today over large area of fluctuance. States that she would like to see how she does and will come back Monday. Wound care reviewed. Follow up on 08/31/2017.  Jaynee Eagles, PA-C [facility] [phone] 08/29/2017 9:32 AM

## 2017-08-29 NOTE — Patient Instructions (Signed)
     IF you received an x-ray today, you will receive an invoice from [facility]. Please contact [facility] at [phone] with questions or concerns regarding your invoice.   IF you received labwork today, you will receive an invoice from [facility]. Please contact [facility] at [phone] with questions or concerns regarding your invoice.   Our billing staff will not be able to assist you with questions regarding bills from these companies.  You will be contacted with the lab results as soon as they are available. The fastest way to get your results is to activate your My Chart account. Instructions are located on the last page of this paperwork. If you have not heard from us regarding the results in 2 weeks, please contact this office.     

## 2017-08-31 ENCOUNTER — Ambulatory Visit: Payer: BLUE CROSS/BLUE SHIELD | Admitting: Physician Assistant

## 2018-06-18 ENCOUNTER — Encounter (HOSPITAL_COMMUNITY): Payer: Self-pay | Admitting: Emergency Medicine

## 2018-06-18 ENCOUNTER — Ambulatory Visit (HOSPITAL_COMMUNITY)
Admission: EM | Admit: 2018-06-18 | Discharge: 2018-06-18 | Disposition: A | Discharge status: Home / Self Care | Payer: BLUE CROSS/BLUE SHIELD

## 2018-06-18 DIAGNOSIS — J069 Acute upper respiratory infection, unspecified: Secondary | ICD-10-CM | POA: Diagnosis not present

## 2018-06-18 DIAGNOSIS — B9789 Other viral agents as the cause of diseases classified elsewhere: Secondary | ICD-10-CM | POA: Diagnosis not present

## 2018-06-18 NOTE — Discharge Instructions (Addendum)
Complete this is a viral upper respiratory infection  you can try switching to Mucinex daytime and nighttime to see if this helps better with your symptoms Follow up as needed for continued or worsening symptoms

## 2018-06-18 NOTE — ED Triage Notes (Signed)
Pt presents to [facility] for assessment of cough, congestion, fevers, body aches x 1 week

## 2018-06-18 NOTE — ED Notes (Signed)
Patient able to ambulate independently  

## 2018-06-18 NOTE — ED Provider Notes (Signed)
[employer]    CSN: [id number] Arrival date & time: 06/18/18  1216     History   Chief Complaint Chief Complaint  Patient presents with  . URI    HPI Sheri Rasmussen is a 38 y.o. female.   Patient is a 38 year old female with past medical history of allergy, diabetes, obesity, pneumonia.  She presents with approximately 1 week of cough, congestion, body aches, chills.  [place] symptoms have been waxing and waning.  She has been taking DayQuil during the day which helps relieve Annett symptoms.  She is also been taking NyQuil at nighttime which helps [place] rest.  She has been coughing up mucus.  She denies any chest pain or shortness of breath.  She denies any worsening of symptoms.  Copher major complaint is that Holm symptoms get worse in the evenings.  She denies any recent traveling or sick contacts.  She does not smoke.   ROS per HPI      Past Medical History:  Diagnosis Date  . Allergy   . AMENORRHEA   . DIABETES MELLITUS, TYPE II   . Morbid obesity ([place])   . PNEUMONIA 10/2009   a/w ARDS/sepsis  . SMOKER     Patient Active Problem List   Diagnosis Date Noted  . Abscess of skin of neck 08/29/2017  . AMENORRHEA 07/26/2010  . MORBID OBESITY 12/04/2009  . SMOKER 12/04/2009  . Diabetes mellitus type 2 in obese ([place]) 10/17/2009    Past Surgical History:  Procedure Laterality Date  . DILATION AND CURETTAGE OF UTERUS     2009  . EAR CYST EXCISION Left 12/12/2013   Procedure: LARYNGOSCOPY;  Surgeon: [name], MD;  Location: [facility];  Service: ENT;  Laterality: Left;    OB History   No obstetric history on file.      Home Medications    Prior to Admission medications   Medication Sig Start Date End Date Taking? Authorizing Provider  Insulin Pen Needle (B-D ULTRAFINE III SHORT PEN) 31G X 8 MM MISC  07/30/14  Yes [provider]  liraglutide (VICTOZA) 18 MG/3ML SOPN  08/14/14  Yes [provider]  metFORMIN (GLUCOPHAGE-XR) 500 MG 24  hr tablet 500 mg 2 (two) times daily. Take 2 by mouth twice a day 07/09/12  Yes [provider]  Prenatal MV-Min-Fe Fum-FA-DHA (PRENATAL+DHA PO) daily.   Yes [provider]  [facility] DELICA LANCETS 63K MISC by Misc.(Non-Drug; Combo Route) route. CHECK BLOOD SUGAR 3 TIMES A DAY    [provider]    Family History Family History  Problem Relation Age of Onset  . Diabetes Mother        gestational  . Cervical cancer Paternal Grandmother     Social History Social History   Tobacco Use  . Smoking status: Current Every Day Smoker    Packs/day: 0.20    Years: 19.00    Pack years: 3.80    Types: Cigarettes  . Smokeless tobacco: Never Used  . Tobacco comment: [place] heritage. married summer 2012. working @ [employer]. Quit 12/12/13  Substance Use Topics  . Alcohol use: Yes    Alcohol/week: 0.0 standard drinks    Comment: socially  . Drug use: No    Comment: Smokes 1-2 cigs a day     Allergies   Patient has no known allergies.   Review of Systems Review of Systems   Physical Exam Triage Vital Signs ED Triage Vitals  Enc Vitals Group  BP 06/18/18 1247 110/76     Pulse Rate 06/18/18 1247 89     Resp 06/18/18 1247 16     Temp 06/18/18 1247 98.1 F (36.7 C)     Temp Source 06/18/18 1247 Oral     SpO2 06/18/18 1247 98 %     Weight --      Height --      Head Circumference --      Peak Flow --      Pain Score 06/18/18 1248 0     Pain Loc --      Pain Edu? --      Excl. in [place]? --    No data found.  Updated Vital Signs BP 110/76 (BP Location: Left Arm)   Pulse 89   Temp 98.1 F (36.7 C) (Oral)   Resp 16   SpO2 98%   Visual Acuity Right Eye Distance:   Left Eye Distance:   Bilateral Distance:    Right Eye Near:   Left Eye Near:    Bilateral Near:     Physical Exam Vitals signs and nursing note reviewed.  Constitutional:      General: She is not in acute distress.    Appearance: Normal appearance. She is obese. She is  not ill-appearing, toxic-appearing or diaphoretic.  HENT:     Head: Normocephalic and atraumatic.     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     Mouth/Throat:     Mouth: Mucous membranes are moist.     Pharynx: Oropharynx is clear.  Eyes:     Conjunctiva/sclera: Conjunctivae normal.  Neck:     Musculoskeletal: Normal range of motion.  Cardiovascular:     Rate and Rhythm: Normal rate and regular rhythm.     Pulses: Normal pulses.     Heart sounds: Normal heart sounds.  Pulmonary:     Effort: Pulmonary effort is normal.     Breath sounds: Normal breath sounds.  Musculoskeletal: Normal range of motion.  Lymphadenopathy:     Cervical: No cervical adenopathy.  Skin:    General: Skin is warm and dry.  Neurological:     Mental Status: She is alert.  Psychiatric:        Mood and Affect: Mood normal.      UC Treatments / Results  Labs (all labs ordered are listed, but only abnormal results are displayed) Labs Reviewed - No data to display  EKG None  Radiology No results found.  Procedures Procedures (including critical care time)  Medications Ordered in UC Medications - No data to display  Initial Impression / Assessment and Plan / UC Course  I have reviewed the triage vital signs and the nursing notes.  Pertinent labs & imaging results that were available during my care of the patient were reviewed by me and considered in my medical decision making (see chart for details).     Exam normal.  Patient is  nontoxic or ill-appearing, vital signs stable. Most likely viral upper respiratory infection We will have Peppel switch to Mucinex daytime and nighttime to see if this helps with symptoms Follow up as needed for continued or worsening symptoms  Final Clinical Impressions(s) / UC Diagnoses   Final diagnoses:  Viral URI with cough     Discharge Instructions     Complete this is a viral upper  respiratory infection  you can try switching to Mucinex daytime and  nighttime to see if this helps better with your symptoms Follow up as needed for continued or worsening symptoms     ED Prescriptions    None     Controlled Substance Prescriptions [place] Controlled Substance Registry consulted? Not Applicable   Orvan July, NP 06/18/18 1318
